# Patient Record
Sex: Male | Born: 1985 | Race: White | Hispanic: No | Marital: Single | Smoking: Current every day smoker
Health system: Southern US, Community
[De-identification: ages and names within clinical notes are randomized; demographics above are authoritative.]

## PROBLEM LIST (undated history)

## (undated) DIAGNOSIS — F1210 Cannabis abuse, uncomplicated: Secondary | ICD-10-CM

## (undated) DIAGNOSIS — Z720 Tobacco use: Secondary | ICD-10-CM

## (undated) DIAGNOSIS — F1990 Other psychoactive substance use, unspecified, uncomplicated: Secondary | ICD-10-CM

---
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
## 2009-03-22 ENCOUNTER — Emergency Department (HOSPITAL_COMMUNITY): Admission: EM | Admit: 2009-03-22 | Discharge: 2009-03-22 | Payer: Self-pay | Admitting: Emergency Medicine

## 2010-11-01 LAB — GC/CHLAMYDIA PROBE AMP, GENITAL
Chlamydia, DNA Probe: NEGATIVE
GC Probe Amp, Genital: POSITIVE — AB

## 2017-04-18 ENCOUNTER — Inpatient Hospital Stay (HOSPITAL_COMMUNITY)
Admission: EM | Admit: 2017-04-18 | Discharge: 2017-04-21 | DRG: 872 | Disposition: A | Discharge status: Home / Self Care | Payer: Self-pay | Attending: Family Medicine | Admitting: Family Medicine

## 2017-04-18 ENCOUNTER — Emergency Department (HOSPITAL_COMMUNITY): Payer: Self-pay

## 2017-04-18 ENCOUNTER — Other Ambulatory Visit: Payer: Self-pay

## 2017-04-18 ENCOUNTER — Encounter (HOSPITAL_COMMUNITY): Payer: Self-pay

## 2017-04-18 DIAGNOSIS — R079 Chest pain, unspecified: Secondary | ICD-10-CM | POA: Diagnosis present

## 2017-04-18 DIAGNOSIS — R000 Tachycardia, unspecified: Secondary | ICD-10-CM | POA: Diagnosis present

## 2017-04-18 DIAGNOSIS — I319 Disease of pericardium, unspecified: Secondary | ICD-10-CM | POA: Diagnosis present

## 2017-04-18 DIAGNOSIS — M25512 Pain in left shoulder: Secondary | ICD-10-CM | POA: Diagnosis present

## 2017-04-18 DIAGNOSIS — A419 Sepsis, unspecified organism: Principal | ICD-10-CM | POA: Diagnosis present

## 2017-04-18 DIAGNOSIS — F17210 Nicotine dependence, cigarettes, uncomplicated: Secondary | ICD-10-CM | POA: Diagnosis present

## 2017-04-18 DIAGNOSIS — F1990 Other psychoactive substance use, unspecified, uncomplicated: Secondary | ICD-10-CM | POA: Diagnosis present

## 2017-04-18 DIAGNOSIS — E876 Hypokalemia: Secondary | ICD-10-CM | POA: Diagnosis present

## 2017-04-18 DIAGNOSIS — Z825 Family history of asthma and other chronic lower respiratory diseases: Secondary | ICD-10-CM

## 2017-04-18 DIAGNOSIS — R0781 Pleurodynia: Secondary | ICD-10-CM | POA: Diagnosis present

## 2017-04-18 DIAGNOSIS — J352 Hypertrophy of adenoids: Secondary | ICD-10-CM | POA: Diagnosis present

## 2017-04-18 DIAGNOSIS — J9811 Atelectasis: Secondary | ICD-10-CM | POA: Diagnosis present

## 2017-04-18 DIAGNOSIS — I272 Pulmonary hypertension, unspecified: Secondary | ICD-10-CM | POA: Diagnosis present

## 2017-04-18 DIAGNOSIS — Z8249 Family history of ischemic heart disease and other diseases of the circulatory system: Secondary | ICD-10-CM

## 2017-04-18 DIAGNOSIS — M940 Chondrocostal junction syndrome [Tietze]: Secondary | ICD-10-CM | POA: Diagnosis present

## 2017-04-18 DIAGNOSIS — M542 Cervicalgia: Secondary | ICD-10-CM | POA: Diagnosis present

## 2017-04-18 DIAGNOSIS — Z720 Tobacco use: Secondary | ICD-10-CM | POA: Diagnosis present

## 2017-04-18 DIAGNOSIS — M009 Pyogenic arthritis, unspecified: Secondary | ICD-10-CM | POA: Diagnosis present

## 2017-04-18 DIAGNOSIS — Z791 Long term (current) use of non-steroidal anti-inflammatories (NSAID): Secondary | ICD-10-CM

## 2017-04-18 DIAGNOSIS — F1210 Cannabis abuse, uncomplicated: Secondary | ICD-10-CM | POA: Diagnosis present

## 2017-04-18 DIAGNOSIS — R509 Fever, unspecified: Secondary | ICD-10-CM

## 2017-04-18 DIAGNOSIS — Z86711 Personal history of pulmonary embolism: Secondary | ICD-10-CM

## 2017-04-18 DIAGNOSIS — J90 Pleural effusion, not elsewhere classified: Secondary | ICD-10-CM | POA: Diagnosis present

## 2017-04-18 DIAGNOSIS — L0390 Cellulitis, unspecified: Secondary | ICD-10-CM | POA: Diagnosis present

## 2017-04-18 DIAGNOSIS — K029 Dental caries, unspecified: Secondary | ICD-10-CM | POA: Diagnosis present

## 2017-04-18 DIAGNOSIS — R0682 Tachypnea, not elsewhere classified: Secondary | ICD-10-CM | POA: Diagnosis present

## 2017-04-18 HISTORY — DX: Cannabis abuse, uncomplicated: F12.10

## 2017-04-18 HISTORY — DX: Tobacco use: Z72.0

## 2017-04-18 HISTORY — DX: Other psychoactive substance use, unspecified, uncomplicated: F19.90

## 2017-04-18 LAB — CBC WITH DIFFERENTIAL/PLATELET
BASOS PCT: 0 %
Basophils Absolute: 0 10*3/uL (ref 0.0–0.1)
EOS ABS: 0.1 10*3/uL (ref 0.0–0.7)
EOS PCT: 1 %
HCT: 33.2 % — ABNORMAL LOW (ref 39.0–52.0)
Hemoglobin: 11.2 g/dL — ABNORMAL LOW (ref 13.0–17.0)
Lymphocytes Relative: 9 %
Lymphs Abs: 1 10*3/uL (ref 0.7–4.0)
MCH: 29.6 pg (ref 26.0–34.0)
MCHC: 33.7 g/dL (ref 30.0–36.0)
MCV: 87.8 fL (ref 78.0–100.0)
MONO ABS: 0.3 10*3/uL (ref 0.1–1.0)
Monocytes Relative: 3 %
Neutro Abs: 9.7 10*3/uL — ABNORMAL HIGH (ref 1.7–7.7)
Neutrophils Relative %: 87 %
Platelets: 344 10*3/uL (ref 150–400)
RBC: 3.78 MIL/uL — ABNORMAL LOW (ref 4.22–5.81)
RDW: 13.3 % (ref 11.5–15.5)
WBC: 11.2 10*3/uL — ABNORMAL HIGH (ref 4.0–10.5)

## 2017-04-18 LAB — URINALYSIS, ROUTINE W REFLEX MICROSCOPIC
Bilirubin Urine: NEGATIVE
Glucose, UA: NEGATIVE mg/dL
HGB URINE DIPSTICK: NEGATIVE
KETONES UR: NEGATIVE mg/dL
Leukocytes, UA: NEGATIVE
NITRITE: NEGATIVE
Protein, ur: NEGATIVE mg/dL
Specific Gravity, Urine: 1.002 — ABNORMAL LOW (ref 1.005–1.030)
pH: 6 (ref 5.0–8.0)

## 2017-04-18 LAB — COMPREHENSIVE METABOLIC PANEL
ALK PHOS: 57 U/L (ref 38–126)
ALT: 7 U/L — ABNORMAL LOW (ref 17–63)
AST: 16 U/L (ref 15–41)
Albumin: 3 g/dL — ABNORMAL LOW (ref 3.5–5.0)
Anion gap: 8 (ref 5–15)
BUN: 7 mg/dL (ref 6–20)
CALCIUM: 8.8 mg/dL — AB (ref 8.9–10.3)
CO2: 24 mmol/L (ref 22–32)
CREATININE: 0.53 mg/dL — AB (ref 0.61–1.24)
Chloride: 105 mmol/L (ref 101–111)
GFR calc non Af Amer: >60 mL/min (ref >60)
Glucose, Bld: 103 mg/dL — ABNORMAL HIGH (ref 65–99)
Potassium: 3.3 mmol/L — ABNORMAL LOW (ref 3.5–5.1)
SODIUM: 137 mmol/L (ref 135–145)
Total Bilirubin: 0.9 mg/dL (ref 0.3–1.2)
Total Protein: 7 g/dL (ref 6.5–8.1)

## 2017-04-18 LAB — I-STAT CG4 LACTIC ACID, ED
LACTIC ACID, VENOUS: 0.7 mmol/L (ref 0.5–1.9)
Lactic Acid, Venous: 1.44 mmol/L (ref 0.5–1.9)

## 2017-04-18 LAB — I-STAT TROPONIN, ED: Troponin i, poc: 0 ng/mL (ref 0.00–0.08)

## 2017-04-18 MED ORDER — POTASSIUM CHLORIDE CRYS ER 20 MEQ PO TBCR
40.0000 meq | EXTENDED_RELEASE_TABLET | Freq: Once | ORAL | Status: AC
  Administered 2017-04-18: 40 meq via ORAL
  Filled 2017-04-18: qty 2

## 2017-04-18 MED ORDER — SODIUM CHLORIDE 0.9 % IV BOLUS (SEPSIS): 1000.0000 mL | Freq: Once | INTRAVENOUS | Status: DC

## 2017-04-18 MED ORDER — KETOROLAC TROMETHAMINE 30 MG/ML IJ SOLN
30.0000 mg | Freq: Once | INTRAMUSCULAR | Status: DC
  Filled 2017-04-18: qty 1

## 2017-04-18 NOTE — ED Notes (Addendum)
Pt presents to the ED after being seen at [facility] in [facility] for chest pain. Pt states he feels as though they didn't do anything for him. Pt reports the pain started in his left shoulder and then progressed to his his upper left chest wall. Pt describes his pain as a pressure. Pt reports taking  of ibuprofen TID for the past 3 days and also reports taking  of oxycontin from off the street. Pt reports he got a prescription from [place] but didn't get it filled because he doesn't have insurance.

## 2017-04-18 NOTE — ED Triage Notes (Signed)
To triage via EMS.  Onset 6 days ago pain to left shoulder, neck, and left chest.  Pt seen at [facility] 2 days ago, dx: with pericarditis and costochondritis.  Pain has worsened and fever today. Pt has taken Ibuprofen 800 mg x 4, Oxy 15 mg, and EMS gave Tyelnol 975 mg.

## 2017-04-18 NOTE — H&P (Signed)
History and Physical    [name]:[id number] DOB: 12-09-1985 DOA: 04/18/2017  Referring MD/NP/PA:   PCP: Patient, No Pcp Per   Patient coming from:  The patient is coming from home.  At baseline, pt is independent for most of ADL.   Chief Complaint: Chest pain, left shoulder pain, left neck pain, fever and chills  HPI: Kevin Ashley is a 31 y.o. male with medical history significant of IV drug user, marijuana abuse, tobacco abuse, who presents with chest pain, left shoulder pain, left neck pain, fever and chills.  Patient states that he has been having chest pain, left neck pain and left shoulder pain in the past 6 days, which has been progressively getting worse. His chest pain is located in the frontal chest, constant, 8 out of 10 in severity, sharp, pleuritic chest pain. It is aggravated by deep breath. His neck pain and shoulder pain are also constant, 10 out of 10 in severity, sharp, nonradiating. He has mild erythema in left shoulder and left neck. Patient also has fever and chills during the same period of time. Patient was seen in [facility] 2 days ago, was diagnosed as possible Pericarditis and Costochondritis. He was started with ibuprofen. Patient states that he has been taking ibuprofen, without significant improvement. He denies cough and SOB. No nausea, vomiting, diarrhea, abdominal pain, symptoms of UTI or unilateral weakness.  ED Course: pt was found to have WBC 11.2, potassium is 3.3, creatinine normal, negative troponin, lactic acid 1.44, 0.70, negative urinalysis, temperature 101.9, tachycardia, tachypnea, oxygen saturation 98% on room air. X-ray of left shoulder negative. CXR showed possible left lower lobe opacity. Patient is admitted to telemetry bed as inpatient.  Review of Systems:   General: has fevers, chills, no body weight gain, has poor appetite, has fatigue HEENT: no blurry vision, hearing changes or sore throat. Has left neck pain. Respiratory: no  dyspnea, coughing, wheezing CV: has chest pain, no palpitations GI: no nausea, vomiting, abdominal pain, diarrhea, constipation GU: no dysuria, burning on urination, increased urinary frequency, hematuria  Ext: no leg edema Neuro: no unilateral weakness, numbness, or tingling, no vision change or hearing loss Skin: no rash, no skin tear. MSK: No muscle spasm, no deformity, no limitation of range of movement in spin. Has left shoulder pain. Heme: No easy bruising.  Travel history: No recent long distant travel.  Allergy: No Known Allergies  Past Medical History:  Diagnosis Date  . IVDU (intravenous drug user)   . Marijuana abuse   . Tobacco abuse     History reviewed. No pertinent surgical history.  Social History:  reports that he has been smoking Cigarettes.  He has been smoking about 0.50 packs per day. He has never used smokeless tobacco. He reports that he uses drugs, including Marijuana. He reports that he does not drink alcohol.  Family History:  Family History  Problem Relation Age of Onset  . COPD Mother   . Valvular heart disease Brother      Prior to Admission medications   Medication Sig Start Date End Date Taking? Authorizing Provider  IBUPROFEN PO Take 2 tablets by mouth 2 (two) times daily as needed (pain).   Yes [provider]    Physical Exam: Vitals:   04/18/17 1526 04/18/17 1940 04/18/17 2217 04/19/17 0000  BP: 133/81 118/61 124/80   Pulse: (!) 104 76 74   Resp: 18 16 (!) 21   Temp: (!) 101.9 F (38.8 C)  TempSrc: Oral     SpO2: 98% 100% 99%   Weight:    77.1 kg (169 lb 15.6 oz)  Height:    '5\' 8"'$  (1.727 m)   General: Not in acute distress HEENT:       Eyes: PERRL, EOMI, no scleral icterus. Has tenderness in left neck, with mild erythema in left and neck       ENT: No discharge from the ears and nose, no pharynx injection, no tonsillar enlargement.        Neck: No JVD, no bruit, no mass felt. Heme: No neck lymph node  enlargement. Cardiac: S1/S2, RRR, No murmurs, No gallops or rubs. Respiratory: No rales, wheezing, rhonchi or rubs. GI: Soft, nondistended, nontender, no rebound pain, no organomegaly, BS present. GU: No hematuria Ext: No pitting leg edema bilaterally. 2+DP/PT pulse bilaterally. Musculoskeletal: has erythema and tenderness in the left shoulder, without significant effusion Skin: No rashes.  Neuro: Alert, oriented X3, cranial nerves II-XII grossly intact, moves all extremities normally Psych: Patient is not psychotic, no suicidal or hemocidal ideation.  Labs on Admission: I have personally reviewed following labs and imaging studies  CBC:  Recent Labs Lab 04/18/17 1545  WBC 11.2*  NEUTROABS 9.7*  HGB 11.2*  HCT 33.2*  MCV 87.8  PLT 885   Basic Metabolic Panel:  Recent Labs Lab 04/18/17 1545  NA 137  K 3.3*  CL 105  CO2 24  GLUCOSE 103*  BUN 7  CREATININE 0.53*  CALCIUM 8.8*   GFR: Estimated Creatinine Clearance: 133 mL/min (A) (by C-G formula based on SCr of 0.53 mg/dL (L)). Liver Function Tests:  Recent Labs Lab 04/18/17 1545  AST 16  ALT 7*  ALKPHOS 57  BILITOT 0.9  PROT 7.0  ALBUMIN 3.0*   No results for input(s): LIPASE, AMYLASE in the last 168 hours. No results for input(s): AMMONIA in the last 168 hours. Coagulation Profile: No results for input(s): INR, PROTIME in the last 168 hours. Cardiac Enzymes: No results for input(s): CKTOTAL, CKMB, CKMBINDEX, TROPONINI in the last 168 hours. BNP (last 3 results) No results for input(s): PROBNP in the last 8760 hours. HbA1C: No results for input(s): HGBA1C in the last 72 hours. CBG: No results for input(s): GLUCAP in the last 168 hours. Lipid Profile: No results for input(s): CHOL, HDL, LDLCALC, TRIG, CHOLHDL, LDLDIRECT in the last 72 hours. Thyroid Function Tests: No results for input(s): TSH, T4TOTAL, FREET4, T3FREE, THYROIDAB in the last 72 hours. Anemia Panel: No results for input(s): VITAMINB12,  FOLATE, FERRITIN, TIBC, IRON, RETICCTPCT in the last 72 hours. Urine analysis:    Component Value Date/Time   COLORURINE STRAW (A) 04/18/2017 1927   APPEARANCEUR CLEAR 04/18/2017 1927   LABSPEC 1.002 (L) 04/18/2017 1927   PHURINE 6.0 04/18/2017 1927   GLUCOSEU NEGATIVE 04/18/2017 1927   HGBUR NEGATIVE 04/18/2017 1927   BILIRUBINUR NEGATIVE 04/18/2017 [place] NEGATIVE 04/18/2017 1927   PROTEINUR NEGATIVE 04/18/2017 1927   NITRITE NEGATIVE 04/18/2017 1927   LEUKOCYTESUR NEGATIVE 04/18/2017 1927   Sepsis Labs: '@LABRCNTIP'$ (procalcitonin:4,lacticidven:4) ) Recent Results (from the past 240 hour(s))  Culture, blood (routine x 2)     Status: None (Preliminary result)   Collection Time: 04/18/17 10:45 PM  Result Value Ref Range Status   Specimen Description BLOOD RIGHT ANTECUBITAL  Final   Special Requests   Final    BOTTLES DRAWN AEROBIC AND ANAEROBIC Blood Culture adequate volume   Culture PENDING  Incomplete   Report Status PENDING  Incomplete  Radiological Exams on Admission: Dg Chest 2 View  Result Date: 04/18/2017 CLINICAL DATA:  Pt reports onset 6 days ago pain to left shoulder, neck, and left chest. Also reports some SOB. Pt reports he was seen at [facility] 2 days ago, dx: with pericarditis and costochondritis. Pain has worsened and fever today. Smoker EXAM: CHEST  2 VIEW COMPARISON:  04/15/2017 FINDINGS: There is focal opacity in the posterior left lung base which may reflect pneumonia or atelectasis. This was present on the prior radiographs, and is without significant change. Remainder of the lungs is clear. There may be a minimal left pleural effusion. No right pleural effusion. No pneumothorax. Heart, mediastinum and hila are unremarkable. Skeletal structures are unremarkable. IMPRESSION: 1. Posterior left lower lobe opacity that is most likely atelectasis. Infection is possible. This is similar to what was present on the prior radiographs. 2. No other  abnormalities. Electronically Signed   By: Lajean Manes M.D.   On: 04/18/2017 16:31   Dg Shoulder Left  Result Date: 04/18/2017 CLINICAL DATA:  Pain for 6 days.  Unable to lift arm. EXAM: LEFT SHOULDER - 2+ VIEW COMPARISON:  None. FINDINGS: The humeral head is well-formed and located. The subacromial, glenohumeral and acromioclavicular joint spaces are intact. No destructive bony lesions. Soft tissue planes are non-suspicious. IMPRESSION: Negative. Electronically Signed   By: Elon Alas M.D.   On: 04/18/2017 23:47     EKG: Independently reviewed.  Tachycardia, QTC 417, anteroseptal infarction pattern.  Assessment/Plan Principal Problem:   Sepsis ([facility]) Active Problems:   IVDU (intravenous drug user)   Tobacco abuse   Marijuana abuse   Hypokalemia   Chest pain   Left shoulder pain   Neck pain on left side   Sepsis [facility]): Patient meets criteria for sepsis with leukocytosis, tachycardia, tachypnea and fever. Currently hemodynamically stable. Lactic acid is normal. DD is endocarditis given a history of IVDU, pulmonary septic emboli, septic left shoulder joint and cellulitis of the left neck.  - will admit to tele bed as inpt - Empiric antimicrobial treatment with vancomycin and Zosyn per pharmacy - PRN Zofran for nausea, Percocet for pain - continue ibuprofen - Blood cultures x 2  - ESR and CRP - will get Procalcitonin and trend lactic acid levels per sepsis protocol. - IVF: 2.5 L of NS bolus in ED, followed by 125 cc/h - will get CT-left shoulder, Chest and left neck (I was called by CT and told that they will do CT-chest and neck, try to include the left shoulder in two images, will not need Ct-left shoulder) - 2 d echo  IVDU, tobacco abuse and marijuana abuse -Did counseling about importance of quitting subsidence -Check UDS -check HIV ab  Hypokalemia: K=3.3 on admission. - Repleted - Check Mg level    DVT ppx: SQ Lovenox Code Status: Full code Family  Communication: None at bed side.   Disposition Plan:  Anticipate discharge back to previous home environment Consults called:  none Admission status: Inpatient/tele       Date of Service 04/19/2017    Ivor Costa [facility] Pager [phone]  If 7PM-7AM, please contact night-coverage [email or web address] Password [email or web address] 04/19/2017, 2:07 AM

## 2017-04-18 NOTE — ED Notes (Signed)
The pt is still c/o chest pain  He has been traiged

## 2017-04-18 NOTE — ED Provider Notes (Signed)
[facility]-EMERGENCY DEPT Provider Note   CSN: [id number] Arrival date & time: 04/18/17  1522     History   Chief Complaint No chief complaint on file.   HPI Kevin Ashley is a 31 y.o. male.  HPI   Kevin Ashley is a 31 y.o. male, with a history of IV drug use, presenting to the ED with Chest pain and left shoulder pain beginning about 6 days ago. Pain originally began in the superior left shoulder and has since spread into the left chest. Pain has been worsening since it began. Pain in the shoulder is constant, chest pain intermittent. Pain is described as a squeezing, 10/10, worse with deep breathing. Patient also endorses chills and shortness of breath. Patient was seen at [facility] on September 20, diagnosed with pericarditis versus costochondritis, and treated with naproxen  twice a day. Patient states he does not have insurance and therefore did not get the naproxen filled, but has been taking "2 pills 3 times a day from a previous prescription that I think are ibuprofen 600 mg." Patient endorses taking 2 doses of the ibuprofen today as well as one 15 mg oxycodone tablet that he obtained "on the street." He endorses occasional PO unprescribed oxycodone use. Endorses previous IV drug use of oxycodone, but states he has not used this drug in this way for at least a year and a half.  Denies N/V/D, cough, abdominal pain, rashes, peripheral edema, or any other complaints.    Past Medical History:  Diagnosis Date  . IVDU (intravenous drug user)   . Marijuana abuse   . Tobacco abuse     Patient Active Problem List   Diagnosis Date Noted  . Sepsis (H[facility]) 04/18/2017  . Hypokalemia 04/18/2017  . Chest pain 04/18/2017  . Left shoulder pain 04/18/2017  . Neck pain on left side 04/18/2017  . IVDU (intravenous drug user)   . Tobacco abuse   . Marijuana abuse     History reviewed. No pertinent surgical history.     Home Medications    Prior to Admission medications     Medication Sig Start Date End Date Taking? Authorizing Provider  IBUPROFEN PO Take 2 tablets by mouth 2 (two) times daily as needed (pain).   Yes [provider]    Family History Family History  Problem Relation Age of Onset  . COPD Mother   . Valvular heart disease Brother     Social History Social History  Substance Use Topics  . Smoking status: Current Every Day Smoker    Packs/day: 0.50    Types: Cigarettes  . Smokeless tobacco: Never Used  . Alcohol use No     Allergies   Patient has no known allergies.   Review of Systems Review of Systems  Constitutional: Positive for chills.  Respiratory: Positive for shortness of breath. Negative for cough.   Cardiovascular: Positive for chest pain. Negative for leg swelling.  Gastrointestinal: Negative for abdominal pain, diarrhea, nausea and vomiting.  Musculoskeletal: Positive for back pain.  Neurological: Negative for dizziness, weakness, light-headedness, numbness and headaches.  All other systems reviewed and are negative.    Physical Exam Updated Vital Signs BP 118/61   Pulse 76   Temp (!) 101.9 F (38.8 C) (Oral)   Resp 16   SpO2 100%   Physical Exam  Constitutional: He appears well-developed and well-nourished. No distress.  HENT:  Head: Normocephalic and atraumatic.  Eyes: Conjunctivae are normal.  Neck: Neck supple.  Cardiovascular: Normal  rate, regular rhythm and intact distal pulses.   Pulses:      Radial pulses are 2+ on the right side, and 2+ on the left side.  Questionable murmur most notable at the tricuspid and mitral locations. Change in the patient's position does not increase or decrease his pain.  Pulmonary/Chest: Effort normal and breath sounds normal. No respiratory distress.    Abdominal: Soft. There is no tenderness. There is no guarding.  Musculoskeletal: He exhibits tenderness. He exhibits no edema.  Tenderness to the anterior and superior left shoulder and clavicle. No  noted edema. Full passive range of motion with pain noted especially with abduction and flexion.  Lymphadenopathy:    He has no cervical adenopathy.  Neurological: He is alert.  Skin: Skin is warm and dry. Capillary refill takes less than 2 seconds. He is not diaphoretic.  Psychiatric: He has a normal mood and affect. His behavior is normal.  Nursing note and vitals reviewed.    ED Treatments / Results  Labs (all labs ordered are listed, but only abnormal results are displayed) Labs Reviewed  COMPREHENSIVE METABOLIC PANEL - Abnormal; Notable for the following:       Result Value   Potassium 3.3 (*)    Glucose, Bld 103 (*)    Creatinine, Ser 0.53 (*)    Calcium 8.8 (*)    Albumin 3.0 (*)    ALT 7 (*)    All other components within normal limits  CBC WITH DIFFERENTIAL/PLATELET - Abnormal; Notable for the following:    WBC 11.2 (*)    RBC 3.78 (*)    Hemoglobin 11.2 (*)    HCT 33.2 (*)    Neutro Abs 9.7 (*)    All other components within normal limits  URINALYSIS, ROUTINE W REFLEX MICROSCOPIC - Abnormal; Notable for the following:    Color, Urine STRAW (*)    Specific Gravity, Urine 1.002 (*)    All other components within normal limits  CULTURE, BLOOD (ROUTINE X 2)  CULTURE, BLOOD (ROUTINE X 2)  CULTURE, BLOOD (SINGLE)  RAPID URINE DRUG SCREEN, HOSP PERFORMED  MAGNESIUM  HIV ANTIBODY (ROUTINE TESTING)  BASIC METABOLIC PANEL  CBC  LACTIC ACID, PLASMA  PROCALCITONIN  PROTIME-INR  APTT  I-STAT CG4 LACTIC ACID, ED  I-STAT CG4 LACTIC ACID, ED  I-STAT TROPONIN, ED  I-STAT TROPONIN, ED    EKG  EKG Interpretation  Date/Time:  Sunday April 18 2017 15:19:56 EDT Ventricular Rate:  100 PR Interval:  130 QRS Duration: 88 QT Interval:  324 QTC Calculation: 417 R Axis:   78 Text Interpretation:  Normal sinus rhythm Normal ECG no prior EKG  Confirmed by Crista Curb [phone]) on 04/18/2017 10:28:25 PM       Radiology Dg Chest 2 View  Result Date: 04/18/2017 CLINICAL  DATA:  Pt reports onset 6 days ago pain to left shoulder, neck, and left chest. Also reports some SOB. Pt reports he was seen at [facility] 2 days ago, dx: with pericarditis and costochondritis. Pain has worsened and fever today. Smoker EXAM: CHEST  2 VIEW COMPARISON:  04/15/2017 FINDINGS: There is focal opacity in the posterior left lung base which may reflect pneumonia or atelectasis. This was present on the prior radiographs, and is without significant change. Remainder of the lungs is clear. There may be a minimal left pleural effusion. No right pleural effusion. No pneumothorax. Heart, mediastinum and hila are unremarkable. Skeletal structures are unremarkable. IMPRESSION: 1. Posterior left lower lobe opacity that is most  likely atelectasis. Infection is possible. This is similar to what was present on the prior radiographs. 2. No other abnormalities. Electronically Signed   By: Amie Portland M.D.   On: 04/18/2017 16:31   Dg Shoulder Left  Result Date: 04/18/2017 CLINICAL DATA:  Pain for 6 days.  Unable to lift arm. EXAM: LEFT SHOULDER - 2+ VIEW COMPARISON:  None. FINDINGS: The humeral head is well-formed and located. The subacromial, glenohumeral and acromioclavicular joint spaces are intact. No destructive bony lesions. Soft tissue planes are non-suspicious. IMPRESSION: Negative. Electronically Signed   By: Awilda Metro M.D.   On: 04/18/2017 23:47    Procedures Angiocath insertion Date/Time: 04/19/2017 12:02 AM Performed by: Anselm Pancoast Authorized by: Harolyn Rutherford C  Consent: Verbal consent obtained. Risks and benefits: risks, benefits and alternatives were discussed Consent given by: patient Patient understanding: patient states understanding of the procedure being performed Patient consent: the patient's understanding of the procedure matches consent given Procedure consent: procedure consent matches procedure scheduled Patient identity confirmed: verbally with patient Local  anesthesia used: no  Anesthesia: Local anesthesia used: no  Sedation: Patient sedated: no Patient tolerance: Patient tolerated the procedure well with no immediate complications Comments: Ultrasound-guided 20-gauge Angiocath started in the right distal, medial, upper arm. Positive flash and blood return. Flushes easily without pain, swelling, or other signs of infiltration.    (including critical care time)  Medications Ordered in ED Medications  ibuprofen (ADVIL,MOTRIN) 100 MG/5ML suspension 400 mg (not administered)  sodium chloride 0.9 % bolus 2,500 mL (not administered)  0.9 %  sodium chloride infusion (not administered)  enoxaparin (LOVENOX) injection 40 mg (not administered)  acetaminophen (TYLENOL) tablet 650 mg (not administered)    Or  acetaminophen (TYLENOL) suppository 650 mg (not administered)  ondansetron (ZOFRAN) tablet 4 mg (not administered)    Or  ondansetron (ZOFRAN) injection 4 mg (not administered)  zolpidem (AMBIEN) tablet 5 mg (not administered)  nicotine (NICODERM CQ - dosed in mg/24 hours) patch 21 mg (not administered)  vancomycin (VANCOCIN) IVPB 1000 mg/200 mL premix (not administered)  piperacillin-tazobactam (ZOSYN) IVPB 3.375 g (not administered)  piperacillin-tazobactam (ZOSYN) IVPB 3.375 g (not administered)  potassium chloride SA (K-DUR,KLOR-CON) CR tablet 40 mEq (40 mEq Oral Given 04/18/17 2312)     Initial Impression / Assessment and Plan / ED Course  I have reviewed the triage vital signs and the nursing notes.  Pertinent labs & imaging results that were available during my care of the patient were reviewed by me and considered in my medical decision making (see chart for details).  Clinical Course as of Apr 20 47  Wynelle Link Apr 18, 2017  2143 Patient not yet in the room.  [SJ]  2347 Spoke with Dr. Clyde Lundborg, hospitalist. Agrees to admit the patient.  [SJ]  2355 Upon reevaluation, patient has tenderness and erythema to the left superior shoulder and  left neck. Patient more hesitant to move left shoulder.   [SJ]    Clinical Course User Index [SJ] Joy, Shawn C, PA-C    Patient presents with left shoulder and chest pain for the last 6 days. Febrile with history of IV drug abuse. 3 sets of blood cultures drawn from 3 different sites. Admission for workup of possible endocarditis. Septic joint in left shoulder thought to be less likely due to ability to range the joint and lack of local edema, however, not out of the differential.    Findings and plan of care discussed with Crista Curb, MD.   Vitals:  04/18/17 1526 04/18/17 1940 04/18/17 2217 04/19/17 0000  BP: 133/81 118/61 124/80   Pulse: (!) 104 76 74   Resp: 18 16 (!) 21   Temp: (!) 101.9 F (38.8 C)     TempSrc: Oral     SpO2: 98% 100% 99%   Weight:    77.1 kg (169 lb 15.6 oz)  Height:     (1.727 m)     Final Clinical Impressions(s) / ED Diagnoses   Final diagnoses:  IVDU (intravenous drug user)  Chest pain, unspecified type  Fever, unspecified fever cause  Tobacco abuse  Marijuana abuse    New Prescriptions New Prescriptions   No medications on file     Concepcion Living 04/19/17 0049    Lavera Guise, MD 04/19/17 0110

## 2017-04-18 NOTE — ED Notes (Signed)
Pt asking how long until he is seem he reports that he has been here by ems at 1300 however he was checked in at 1522 which I  Told him

## 2017-04-19 ENCOUNTER — Inpatient Hospital Stay (HOSPITAL_COMMUNITY): Payer: Self-pay

## 2017-04-19 ENCOUNTER — Encounter (HOSPITAL_COMMUNITY): Payer: Self-pay | Admitting: Internal Medicine

## 2017-04-19 DIAGNOSIS — R072 Precordial pain: Secondary | ICD-10-CM

## 2017-04-19 LAB — CBC
HCT: 31.1 % — ABNORMAL LOW (ref 39.0–52.0)
Hemoglobin: 10.2 g/dL — ABNORMAL LOW (ref 13.0–17.0)
MCH: 29.1 pg (ref 26.0–34.0)
MCHC: 32.8 g/dL (ref 30.0–36.0)
MCV: 88.9 fL (ref 78.0–100.0)
Platelets: 228 10*3/uL (ref 150–400)
RBC: 3.5 MIL/uL — ABNORMAL LOW (ref 4.22–5.81)
RDW: 13.6 % (ref 11.5–15.5)
WBC: 8.5 10*3/uL (ref 4.0–10.5)

## 2017-04-19 LAB — BASIC METABOLIC PANEL
Anion gap: 7 (ref 5–15)
BUN: 5 mg/dL — AB (ref 6–20)
CALCIUM: 7.9 mg/dL — AB (ref 8.9–10.3)
CO2: 23 mmol/L (ref 22–32)
Chloride: 108 mmol/L (ref 101–111)
Creatinine, Ser: 0.51 mg/dL — ABNORMAL LOW (ref 0.61–1.24)
GFR calc Af Amer: >60 mL/min (ref >60)
GLUCOSE: 95 mg/dL (ref 65–99)
Potassium: 3.5 mmol/L (ref 3.5–5.1)
SODIUM: 138 mmol/L (ref 135–145)

## 2017-04-19 LAB — ECHOCARDIOGRAM COMPLETE
Height: 68 in
WEIGHTICAEL: 2719.59 [oz_av]

## 2017-04-19 LAB — RAPID URINE DRUG SCREEN, HOSP PERFORMED
AMPHETAMINES: NOT DETECTED
BENZODIAZEPINES: NOT DETECTED
Barbiturates: NOT DETECTED
COCAINE: NOT DETECTED
OPIATES: NOT DETECTED
Tetrahydrocannabinol: POSITIVE — AB

## 2017-04-19 LAB — SEDIMENTATION RATE: SED RATE: 84 mm/h — AB (ref 0–16)

## 2017-04-19 LAB — I-STAT TROPONIN, ED: Troponin i, poc: 0 ng/mL (ref 0.00–0.08)

## 2017-04-19 LAB — LACTIC ACID, PLASMA: LACTIC ACID, VENOUS: 0.8 mmol/L (ref 0.5–1.9)

## 2017-04-19 LAB — PROTIME-INR
INR: 1.13
Prothrombin Time: 14.5 seconds (ref 11.4–15.2)

## 2017-04-19 LAB — APTT: aPTT: 77 seconds — ABNORMAL HIGH (ref 24–36)

## 2017-04-19 LAB — CBG MONITORING, ED: GLUCOSE-CAPILLARY: 114 mg/dL — AB (ref 65–99)

## 2017-04-19 LAB — HIV ANTIBODY (ROUTINE TESTING W REFLEX): HIV SCREEN 4TH GENERATION: NONREACTIVE

## 2017-04-19 LAB — MAGNESIUM: Magnesium: 1.5 mg/dL — ABNORMAL LOW (ref 1.7–2.4)

## 2017-04-19 LAB — C-REACTIVE PROTEIN: CRP: 9.3 mg/dL — AB (ref <1.0)

## 2017-04-19 LAB — PROCALCITONIN: PROCALCITONIN: 0.58 ng/mL

## 2017-04-19 MED ORDER — IBUPROFEN 100 MG/5ML PO SUSP
400.0000 mg | Freq: Four times a day (QID) | ORAL | Status: DC
  Administered 2017-04-19 – 2017-04-21 (×11): 400 mg via ORAL
  Filled 2017-04-19 (×12): qty 20

## 2017-04-19 MED ORDER — PIPERACILLIN-TAZOBACTAM 3.375 G IVPB 30 MIN
3.3750 g | Freq: Once | INTRAVENOUS | Status: AC
  Administered 2017-04-19: 3.375 g via INTRAVENOUS
  Filled 2017-04-19: qty 50

## 2017-04-19 MED ORDER — ENOXAPARIN SODIUM 40 MG/0.4ML ~~LOC~~ SOLN
40.0000 mg | SUBCUTANEOUS | Status: DC
  Administered 2017-04-20: 40 mg via SUBCUTANEOUS
  Filled 2017-04-19 (×3): qty 0.4

## 2017-04-19 MED ORDER — VANCOMYCIN HCL IN DEXTROSE 1-5 GM/200ML-% IV SOLN
1000.0000 mg | Freq: Three times a day (TID) | INTRAVENOUS | Status: DC
  Administered 2017-04-19 – 2017-04-21 (×9): 1000 mg via INTRAVENOUS
  Filled 2017-04-19 (×11): qty 200

## 2017-04-19 MED ORDER — ACETAMINOPHEN 325 MG PO TABS
650.0000 mg | ORAL_TABLET | Freq: Four times a day (QID) | ORAL | Status: DC | PRN
  Administered 2017-04-20: 650 mg via ORAL
  Filled 2017-04-19: qty 2

## 2017-04-19 MED ORDER — ZOLPIDEM TARTRATE 5 MG PO TABS
5.0000 mg | ORAL_TABLET | Freq: Every evening | ORAL | Status: DC | PRN
  Administered 2017-04-19 – 2017-04-20 (×3): 5 mg via ORAL
  Filled 2017-04-19 (×3): qty 1

## 2017-04-19 MED ORDER — IOPAMIDOL (ISOVUE-300) INJECTION 61%
INTRAVENOUS | Status: AC
  Administered 2017-04-19: 75 mL
  Filled 2017-04-19: qty 75

## 2017-04-19 MED ORDER — NICOTINE 21 MG/24HR TD PT24
21.0000 mg | MEDICATED_PATCH | Freq: Every day | TRANSDERMAL | Status: DC
Start: 2017-04-19 — End: 2017-04-21
  Administered 2017-04-19 – 2017-04-20 (×2): 21 mg via TRANSDERMAL
  Filled 2017-04-19 (×2): qty 1

## 2017-04-19 MED ORDER — OXYCODONE-ACETAMINOPHEN 5-325 MG PO TABS
1.0000 | ORAL_TABLET | ORAL | Status: DC | PRN
  Administered 2017-04-19 – 2017-04-21 (×9): 1 via ORAL
  Filled 2017-04-19 (×10): qty 1

## 2017-04-19 MED ORDER — ONDANSETRON HCL 4 MG/2ML IJ SOLN: 4.0000 mg | Freq: Four times a day (QID) | INTRAMUSCULAR | Status: DC | PRN

## 2017-04-19 MED ORDER — PIPERACILLIN-TAZOBACTAM 3.375 G IVPB
3.3750 g | Freq: Three times a day (TID) | INTRAVENOUS | Status: DC
  Administered 2017-04-19 – 2017-04-21 (×7): 3.375 g via INTRAVENOUS
  Filled 2017-04-19 (×10): qty 50

## 2017-04-19 MED ORDER — ONDANSETRON HCL 4 MG PO TABS: 4.0000 mg | ORAL_TABLET | Freq: Four times a day (QID) | ORAL | Status: DC | PRN

## 2017-04-19 MED ORDER — SODIUM CHLORIDE 0.9 % IV SOLN
INTRAVENOUS | Status: DC
  Administered 2017-04-19 – 2017-04-21 (×5): via INTRAVENOUS

## 2017-04-19 MED ORDER — SODIUM CHLORIDE 0.9 % IV BOLUS (SEPSIS)
2500.0000 mL | Freq: Once | INTRAVENOUS | Status: AC
  Administered 2017-04-19: 2500 mL via INTRAVENOUS

## 2017-04-19 MED ORDER — ACETAMINOPHEN 650 MG RE SUPP: 650.0000 mg | Freq: Four times a day (QID) | RECTAL | Status: DC | PRN

## 2017-04-19 NOTE — Progress Notes (Signed)
  Echocardiogram 2D Echocardiogram has been performed.  Pieter Partridge 04/19/2017, 10:31 AM

## 2017-04-19 NOTE — ED Notes (Signed)
Attempted report 

## 2017-04-19 NOTE — ED Notes (Signed)
Meal tray ordered 

## 2017-04-19 NOTE — Progress Notes (Signed)
Patient ID: Kevin Ashley, male   DOB: 1986/04/18, 31 y.o.   MRN: [id number]  PROGRESS NOTE    Kevin Ashley  YME:[id number] DOB: 10/30/1985 DOA: 04/18/2017  PCP: Patient, No Pcp Per   Brief Narrative:  31 year old male with history of IV drug use, marijuana and tobacco abuse who presented to ED with chest pain, left shoulder and neck pain, fever and chills for past week or so prior to this admission. Chest pain is located in the mid sternal area, 8 out of 10 in severity, pleuritic in nature, radiating to shoulder and neck. Patient was in [facility] about 2 days ago and was diagnosed with possible pericarditis and costochondritis and he was started on ibuprofen. His symptoms did not improve.  On admission, blood pressure was stable. Patient was febrile with temperature of 101.88F, tachycardic, tachypneic. His blood work was significant for mild leukocytosis of 11.2, hemoglobin 11.2, potassium 3.3, normal lactic acid. Chest x-ray showed posterior left lower lobe opacity most likely atelectasis or possible infection. CT chest showed small left pleural effusion and adjacent compressive atelectasis but no large central pulmonary embolus or septic emboli. CT shoulder showed no evidence of fracture or bone destruction but it did show possible parapneumonic effusion with pulmonary consolidation in the left lower lobe. Sepsis criteria met on the admission and patient was started on vancomycin and Zosyn while awaiting culture results.   Assessment & Plan:   Principal Problem:   Sepsis ([place]) / Possible parapneumonic effusion / Leukocytosis  - Sepsis criteria met on admission with fever, tachycardia, tachypnea, leukocytosis - Lactic acid is WNL - UA unremarkable  - CXR showed posterior left lower lobe opacity that is most likely atelectasis. Infection is possible. - CT shoulder with possible parapneumonic effusion with pulmonary consolidation in the left lower lobe - Blood cultures and urine  culture ordered - pending  - Started broad spectrum antibiotics: vanco and zosyn - Rule out possible endocarditis, TTE ordered for now  Active Problems:   IVDU (intravenous drug user) / tobacco abuse / marijuana abuse - Counseled on drug abuse    Chest pain / Left shoulder and neck pain / Possible odontogenic cellulitis - Likely in the setting of sepsis and acute infectious process - CT scan of the neck showed multiple dental caries and periapical lucencies. Dental disease involving teeth 17 and 18 is adjacent to very mild lower left facial fat infiltration, possibly indicating odontogenic cellulitis in the appropriate clinical context.  Multiple mildly enlarged reactive cervical lymph nodes.  Enlarged adenoid and palatine tonsils without abscess or drainable fluid collection. In this setting, this is likely reactive lymphatic tissue. Correlate for symptoms of tonsillopharyngitis.  - CT chest showed small left pleural effusion with adjacent compressive atelectasis.  No large central pulmonary embolus. No evidence of septic emboli.  - CT shoulder showed no evidence of fracture, bone destruction or bursitis. - Pt already getting abx for sepsis     Hypokalemia - Due to sepsis - Supplemented and WNL  DVT prophylaxis: Lovenox subcutaneous Code Status: full code  Family Communication: Family at the bedside Disposition Plan: Not yet stable for discharge, receiving antibiotics for sepsis and possible endocarditis   Consultants:   None   Procedures:   None   Antimicrobials:   Vanco and zosyn 04/18/2017 -_>    Subjective: Has left shoulder pain.  Objective: Vitals:   04/19/17 0340 04/19/17 0400 04/19/17 0500 04/19/17 0600  BP: 107/68 112/86 115/73 107/71  Pulse:  Resp: '19 11 12 18  '$ Temp:      TempSrc:      SpO2:      Weight:      Height:        Intake/Output Summary (Last 24 hours) at 04/19/17 0814 Last data filed at 04/19/17 0653  Gross per 24 hour  Intake              2950 ml  Output                0 ml  Net             2950 ml   Filed Weights   04/19/17 0000  Weight: 77.1 kg (169 lb 15.6 oz)    Examination:  General exam: Appears calm and comfortable  Respiratory system: Clear to auscultation. Respiratory effort normal. Cardiovascular system: S1 & S2 heard, RRR. Gastrointestinal system: Abdomen is nondistended, soft and nontender. No organomegaly or masses felt. Normal bowel sounds heard. Central nervous system: Alert and oriented. No focal neurological deficits. Extremities: Symmetric 5 x 5 power. Skin: No rashes, lesions or ulcers Psychiatry: Judgement and insight appear normal. Mood & affect appropriate.   Data Reviewed: I have personally reviewed following labs and imaging studies  CBC:  Recent Labs Lab 04/18/17 1545 04/19/17 0406  WBC 11.2* 8.5  NEUTROABS 9.7*  --   HGB 11.2* 10.2*  HCT 33.2* 31.1*  MCV 87.8 88.9  PLT 344 841   Basic Metabolic Panel:  Recent Labs Lab 04/18/17 1545 04/19/17 0406  NA 137 138  K 3.3* 3.5  CL 105 108  CO2 24 23  GLUCOSE 103* 95  BUN 7 5*  CREATININE 0.53* 0.51*  CALCIUM 8.8* 7.9*  MG  --  1.5*   GFR: Estimated Creatinine Clearance: 133 mL/min (A) (by C-G formula based on SCr of 0.51 mg/dL (L)). Liver Function Tests:  Recent Labs Lab 04/18/17 1545  AST 16  ALT 7*  ALKPHOS 57  BILITOT 0.9  PROT 7.0  ALBUMIN 3.0*   No results for input(s): LIPASE, AMYLASE in the last 168 hours. No results for input(s): AMMONIA in the last 168 hours. Coagulation Profile:  Recent Labs Lab 04/19/17 0406  INR 1.13   Cardiac Enzymes: No results for input(s): CKTOTAL, CKMB, CKMBINDEX, TROPONINI in the last 168 hours. BNP (last 3 results) No results for input(s): PROBNP in the last 8760 hours. HbA1C: No results for input(s): HGBA1C in the last 72 hours. CBG:  Recent Labs Lab 04/19/17 0808  GLUCAP 114*   Lipid Profile: No results for input(s): CHOL, HDL, LDLCALC, TRIG, CHOLHDL,  LDLDIRECT in the last 72 hours. Thyroid Function Tests: No results for input(s): TSH, T4TOTAL, FREET4, T3FREE, THYROIDAB in the last 72 hours. Anemia Panel: No results for input(s): VITAMINB12, FOLATE, FERRITIN, TIBC, IRON, RETICCTPCT in the last 72 hours. Urine analysis:    Component Value Date/Time   COLORURINE STRAW (A) 04/18/2017 1927   APPEARANCEUR CLEAR 04/18/2017 1927   LABSPEC 1.002 (L) 04/18/2017 1927   PHURINE 6.0 04/18/2017 1927   GLUCOSEU NEGATIVE 04/18/2017 1927   HGBUR NEGATIVE 04/18/2017 1927   BILIRUBINUR NEGATIVE 04/18/2017 [place] NEGATIVE 04/18/2017 1927   PROTEINUR NEGATIVE 04/18/2017 1927   NITRITE NEGATIVE 04/18/2017 1927   LEUKOCYTESUR NEGATIVE 04/18/2017 1927   Sepsis Labs: '@LABRCNTIP'$ (procalcitonin:4,lacticidven:4)    Recent Results (from the past 240 hour(s))  Culture, blood (routine x 2)     Status: None (Preliminary result)   Collection Time: 04/18/17 10:45 PM  Result Value Ref Range Status   Specimen Description BLOOD RIGHT ANTECUBITAL  Final   Special Requests   Final    BOTTLES DRAWN AEROBIC AND ANAEROBIC Blood Culture adequate volume   Culture PENDING  Incomplete   Report Status PENDING  Incomplete      Radiology Studies: Dg Chest 2 View Result Date: 04/18/2017 1. Posterior left lower lobe opacity that is most likely atelectasis. Infection is possible. This is similar to what was present on the prior radiographs. 2. No other abnormalities.   Ct Soft Tissue Neck W Contrast Result Date: 04/19/2017 1. Multiple dental caries and periapical lucencies. Dental disease involving teeth 17 and 18 is adjacent to very mild lower left facial fat infiltration, possibly indicating odontogenic cellulitis in the appropriate clinical context. 2. Multiple mildly enlarged reactive cervical lymph nodes. 3. Enlarged adenoid and palatine tonsils without abscess or drainable fluid collection. In this setting, this is likely reactive lymphatic tissue.  Correlate for symptoms of tonsillopharyngitis.   Ct Chest W Contrast Result Date: 04/19/2017 1. Small left pleural effusion with adjacent compressive atelectasis. 2. No large central pulmonary embolus. 3. No evidence of septic emboli.   Ct Shoulder Left W Contrast Result Date: 04/19/2017 1. Unremarkable CT of the left shoulder. No evidence of fracture, bone destruction or bursitis. 2. Small left pleural effusion with compressive atelectasis. The possibility of a parapneumonic effusion with pulmonary consolidation in the left lower lobe given history of fever is not entirely excluded. Electronically Signed   By: Ashley Royalty M.D.   On: 04/19/2017 03:02   Dg Shoulder Left Result Date: 04/18/2017 Negative. Electronically Signed   By: Elon Alas M.D.   On: 04/18/2017 23:47      Scheduled Meds: . enoxaparin (LOVENOX) injection  40 mg Subcutaneous Q24H  . ibuprofen  400 mg Oral Q6H  . nicotine  21 mg Transdermal Daily   Continuous Infusions: . sodium chloride 125 mL/hr at 04/19/17 0441  . piperacillin-tazobactam (ZOSYN)  IV 3.375 g (04/19/17 0553)  . vancomycin Stopped (04/19/17 0653)     LOS: 0 days    Time spent: 25 minutes  Greater than 50% of the time spent on counseling and coordinating the care.   Leisa Lenz, MD [facility] Pager [phone]  If 7PM-7AM, please contact night-coverage [email or web address] Password [email or web address] 04/19/2017, 8:14 AM

## 2017-04-19 NOTE — ED Notes (Signed)
Meal tray ordered for patient.

## 2017-04-19 NOTE — Progress Notes (Signed)
Pharmacy Antibiotic Note  Kevin Ashley is a 31 y.o. male w/ prior h/o IVDU admitted on 04/18/2017 with concern for possible endocarditis and septic joint.  Pharmacy has been consulted for Vancocin and Zosyn dosing.  Plan: Vancomycin  IV every 8 hours.  Goal trough 15-20 mcg/mL. Zosyn 3.375g IV q8h (4 hour infusion).  Height:  (172.7 cm) Weight: 169 lb 15.6 oz (77.1 kg) IBW/kg (Calculated) : 68.4  Temp (24hrs), Avg:101.9 F (38.8 C), Min:101.9 F (38.8 C), Max:101.9 F (38.8 C)   Recent Labs Lab 04/18/17 1545 04/18/17 1559 04/18/17 1909  WBC 11.2*  --   --   CREATININE 0.53*  --   --   LATICACIDVEN  --  1.44 0.70    Estimated Creatinine Clearance: 133 mL/min (A) (by C-G formula based on SCr of 0.53 mg/dL (L)).    No Known Allergies   Thank you for allowing pharmacy to be a part of this patient's care.  Vernard Gambles, PharmD, BCPS  04/19/2017 12:06 AM

## 2017-04-19 NOTE — ED Notes (Signed)
Patient transported to CT 

## 2017-04-20 DIAGNOSIS — A419 Sepsis, unspecified organism: Principal | ICD-10-CM

## 2017-04-20 LAB — BASIC METABOLIC PANEL
ANION GAP: 5 (ref 5–15)
BUN: 7 mg/dL (ref 6–20)
CHLORIDE: 110 mmol/L (ref 101–111)
CO2: 24 mmol/L (ref 22–32)
Calcium: 8.6 mg/dL — ABNORMAL LOW (ref 8.9–10.3)
Creatinine, Ser: 0.58 mg/dL — ABNORMAL LOW (ref 0.61–1.24)
GFR calc Af Amer: >60 mL/min (ref >60)
GFR calc non Af Amer: >60 mL/min (ref >60)
GLUCOSE: 113 mg/dL — AB (ref 65–99)
POTASSIUM: 3.5 mmol/L (ref 3.5–5.1)
Sodium: 139 mmol/L (ref 135–145)

## 2017-04-20 LAB — CBC
HEMATOCRIT: 32.7 % — AB (ref 39.0–52.0)
Hemoglobin: 10.8 g/dL — ABNORMAL LOW (ref 13.0–17.0)
MCH: 29.1 pg (ref 26.0–34.0)
MCHC: 33 g/dL (ref 30.0–36.0)
MCV: 88.1 fL (ref 78.0–100.0)
Platelets: 294 10*3/uL (ref 150–400)
RBC: 3.71 MIL/uL — AB (ref 4.22–5.81)
RDW: 13.4 % (ref 11.5–15.5)
WBC: 9.6 10*3/uL (ref 4.0–10.5)

## 2017-04-20 LAB — GLUCOSE, CAPILLARY: Glucose-Capillary: 98 mg/dL (ref 65–99)

## 2017-04-20 MED ORDER — INFLUENZA VAC SPLIT QUAD 0.5 ML IM SUSY: 0.5000 mL | PREFILLED_SYRINGE | INTRAMUSCULAR | Status: DC

## 2017-04-20 NOTE — Consult Note (Signed)
Referring Physician: Dr. Verneita Griffes, MD  Kevin Ashley is an 31 y.o. male.                       Chief Complaint: Bacteremia in patient with drug use history, dental caries and shoulder pain  HPI: 31 year old male presented with chest pain, left shoulder pain, fever and chills x 1 week. Patient has h/o IV drug abuse, fever, elevated WBC count and possible sepsis. Consult is for TEE to r/o endocarditis.  Past Medical History:  Diagnosis Date  . IVDU (intravenous drug user)   . Marijuana abuse   . Tobacco abuse       History reviewed. No pertinent surgical history.  Family History  Problem Relation Age of Onset  . COPD Mother   . Valvular heart disease Brother    Social History:  reports that he has been smoking Cigarettes.  He has been smoking about 0.50 packs per day. He has never used smokeless tobacco. He reports that he uses drugs, including Marijuana. He reports that he does not drink alcohol.  Allergies: No Known Allergies  Medications Prior to Admission  Medication Sig Dispense Refill  . IBUPROFEN PO Take 2 tablets by mouth 2 (two) times daily as needed (pain).      Results for orders placed or performed during the hospital encounter of 04/18/17 (from the past 48 hour(s))  I-stat troponin, ED     Status: None   Collection Time: 04/18/17  7:07 PM  Result Value Ref Range   Troponin i, poc 0.00 0.00 - 0.08 ng/mL   Comment 3            Comment: Due to the release kinetics of cTnI, a negative result within the first hours of the onset of symptoms does not rule out myocardial infarction with certainty. If myocardial infarction is still suspected, repeat the test at appropriate intervals.   I-Stat CG4 Lactic Acid, ED     Status: None   Collection Time: 04/18/17  7:09 PM  Result Value Ref Range   Lactic Acid, Venous 0.70 0.5 - 1.9 mmol/L  Urinalysis, Routine w reflex microscopic     Status: Abnormal   Collection Time: 04/18/17  7:27 PM  Result Value Ref Range   Color, Urine STRAW (A) YELLOW   APPearance CLEAR CLEAR   Specific Gravity, Urine 1.002 (L) 1.005 - 1.030   pH 6.0 5.0 - 8.0   Glucose, UA NEGATIVE NEGATIVE mg/dL   Hgb urine dipstick NEGATIVE NEGATIVE   Bilirubin Urine NEGATIVE NEGATIVE   Ketones, ur NEGATIVE NEGATIVE mg/dL   Protein, ur NEGATIVE NEGATIVE mg/dL   Nitrite NEGATIVE NEGATIVE   Leukocytes, UA NEGATIVE NEGATIVE  Urine rapid drug screen (hosp performed)     Status: Abnormal   Collection Time: 04/18/17  7:27 PM  Result Value Ref Range   Opiates NONE DETECTED NONE DETECTED   Cocaine NONE DETECTED NONE DETECTED   Benzodiazepines NONE DETECTED NONE DETECTED   Amphetamines NONE DETECTED NONE DETECTED   Tetrahydrocannabinol POSITIVE (A) NONE DETECTED   Barbiturates NONE DETECTED NONE DETECTED    Comment:        DRUG SCREEN FOR MEDICAL PURPOSES ONLY.  IF CONFIRMATION IS NEEDED FOR ANY PURPOSE, NOTIFY LAB WITHIN 5 DAYS.        LOWEST DETECTABLE LIMITS FOR URINE DRUG SCREEN Drug Class       Cutoff (ng/mL) Amphetamine      1000 Barbiturate  200 Benzodiazepine   194 Tricyclics       174 Opiates          300 Cocaine          300 THC              50   Culture, blood (routine x 2)     Status: None (Preliminary result)   Collection Time: 04/18/17 10:45 PM  Result Value Ref Range   Specimen Description BLOOD RIGHT ANTECUBITAL    Special Requests      BOTTLES DRAWN AEROBIC AND ANAEROBIC Blood Culture adequate volume   Culture PENDING    Report Status PENDING   Culture, blood (routine x 2)     Status: None (Preliminary result)   Collection Time: 04/18/17 11:00 PM  Result Value Ref Range   Specimen Description BLOOD LEFT ANTECUBITAL    Special Requests IN PEDIATRIC BOTTLE Blood Culture adequate volume    Culture NO GROWTH 1 DAY    Report Status PENDING   Magnesium     Status: Abnormal   Collection Time: 04/19/17  4:06 AM  Result Value Ref Range   Magnesium 1.5 (L) 1.7 - 2.4 mg/dL  HIV antibody (Routine Testing)      Status: None   Collection Time: 04/19/17  4:06 AM  Result Value Ref Range   HIV Screen 4th Generation wRfx Non Reactive Non Reactive    Comment: (NOTE) Performed At: [facility] [place], [place] [id number] Lindon Romp MD UD:[id number]   Basic metabolic panel     Status: Abnormal   Collection Time: 04/19/17  4:06 AM  Result Value Ref Range   Sodium 138 135 - 145 mmol/L   Potassium 3.5 3.5 - 5.1 mmol/L   Chloride 108 101 - 111 mmol/L   CO2 23 22 - 32 mmol/L   Glucose, Bld 95 65 - 99 mg/dL   BUN 5 (L) 6 - 20 mg/dL   Creatinine, Ser 0.51 (L) 0.61 - 1.24 mg/dL   Calcium 7.9 (L) 8.9 - 10.3 mg/dL   GFR calc non Af Amer >60 >60 mL/min   GFR calc Af Amer >60 >60 mL/min    Comment: (NOTE) The eGFR has been calculated using the CKD EPI equation. This calculation has not been validated in all clinical situations. eGFR's persistently <60 mL/min signify possible Chronic Kidney Disease.    Anion gap 7 5 - 15  CBC     Status: Abnormal   Collection Time: 04/19/17  4:06 AM  Result Value Ref Range   WBC 8.5 4.0 - 10.5 K/uL   RBC 3.50 (L) 4.22 - 5.81 MIL/uL   Hemoglobin 10.2 (L) 13.0 - 17.0 g/dL   HCT 31.1 (L) 39.0 - 52.0 %   MCV 88.9 78.0 - 100.0 fL   MCH 29.1 26.0 - 34.0 pg   MCHC 32.8 30.0 - 36.0 g/dL   RDW 13.6 11.5 - 15.5 %   Platelets 228 150 - 400 K/uL  Lactic acid, plasma     Status: None   Collection Time: 04/19/17  4:06 AM  Result Value Ref Range   Lactic Acid, Venous 0.8 0.5 - 1.9 mmol/L  Procalcitonin     Status: None   Collection Time: 04/19/17  4:06 AM  Result Value Ref Range   Procalcitonin 0.58 ng/mL    Comment:        Interpretation: PCT > 0.5 ng/mL and <= 2 ng/mL: Systemic infection (sepsis) is possible, but other conditions are  known to elevate PCT as well. (NOTE)         ICU PCT Algorithm               Non ICU PCT Algorithm    ----------------------------     ------------------------------         PCT < 0.25 ng/mL                  PCT < 0.1 ng/mL     Stopping of antibiotics            Stopping of antibiotics       strongly encouraged.               strongly encouraged.    ----------------------------     ------------------------------       PCT level decrease by               PCT < 0.25 ng/mL       >= 80% from peak PCT       OR PCT 0.25 - 0.5 ng/mL          Stopping of antibiotics                                             encouraged.     Stopping of antibiotics           encouraged.    ----------------------------     ------------------------------       PCT level decrease by              PCT >= 0.25 ng/mL       < 80% from peak PCT        AND PCT >= 0.5 ng/mL             Continuing antibiotics                                              encouraged.       Continuing antibiotics            encouraged.    ----------------------------     ------------------------------     PCT level increase compared          PCT > 0.5 ng/mL         with peak PCT AND          PCT >= 0.5 ng/mL             Escalation of antibiotics                                          strongly encouraged.      Escalation of antibiotics        strongly encouraged.   Protime-INR     Status: None   Collection Time: 04/19/17  4:06 AM  Result Value Ref Range   Prothrombin Time 14.5 11.4 - 15.2 seconds   INR 1.13   APTT     Status: Abnormal   Collection Time: 04/19/17  4:06 AM  Result Value Ref Range   aPTT 77 (H) 24 - 36 seconds    Comment:  IF BASELINE aPTT IS ELEVATED, SUGGEST PATIENT RISK ASSESSMENT BE USED TO DETERMINE APPROPRIATE ANTICOAGULANT THERAPY.   C-reactive protein     Status: Abnormal   Collection Time: 04/19/17  4:06 AM  Result Value Ref Range   CRP 9.3 (H) <1.0 mg/dL  Sedimentation rate     Status: Abnormal   Collection Time: 04/19/17  4:06 AM  Result Value Ref Range   Sed Rate 84 (H) 0 - 16 mm/hr  I-stat troponin, ED     Status: None   Collection Time: 04/19/17  4:19 AM  Result Value Ref Range   Troponin  i, poc 0.00 0.00 - 0.08 ng/mL   Comment 3            Comment: Due to the release kinetics of cTnI, a negative result within the first hours of the onset of symptoms does not rule out myocardial infarction with certainty. If myocardial infarction is still suspected, repeat the test at appropriate intervals.   CBG monitoring, ED     Status: Abnormal   Collection Time: 04/19/17  8:08 AM  Result Value Ref Range   Glucose-Capillary 114 (H) 65 - 99 mg/dL  CBC     Status: Abnormal   Collection Time: 04/20/17  7:39 AM  Result Value Ref Range   WBC 9.6 4.0 - 10.5 K/uL   RBC 3.71 (L) 4.22 - 5.81 MIL/uL   Hemoglobin 10.8 (L) 13.0 - 17.0 g/dL   HCT 32.7 (L) 39.0 - 52.0 %   MCV 88.1 78.0 - 100.0 fL   MCH 29.1 26.0 - 34.0 pg   MCHC 33.0 30.0 - 36.0 g/dL   RDW 13.4 11.5 - 15.5 %   Platelets 294 150 - 400 K/uL  Basic metabolic panel     Status: Abnormal   Collection Time: 04/20/17  7:39 AM  Result Value Ref Range   Sodium 139 135 - 145 mmol/L   Potassium 3.5 3.5 - 5.1 mmol/L   Chloride 110 101 - 111 mmol/L   CO2 24 22 - 32 mmol/L   Glucose, Bld 113 (H) 65 - 99 mg/dL   BUN 7 6 - 20 mg/dL   Creatinine, Ser 0.58 (L) 0.61 - 1.24 mg/dL   Calcium 8.6 (L) 8.9 - 10.3 mg/dL   GFR calc non Af Amer >60 >60 mL/min   GFR calc Af Amer >60 >60 mL/min    Comment: (NOTE) The eGFR has been calculated using the CKD EPI equation. This calculation has not been validated in all clinical situations. eGFR's persistently <60 mL/min signify possible Chronic Kidney Disease.    Anion gap 5 5 - 15  Glucose, capillary     Status: None   Collection Time: 04/20/17  8:20 AM  Result Value Ref Range   Glucose-Capillary 98 65 - 99 mg/dL   Ct Soft Tissue Neck W Contrast  Result Date: 04/19/2017 CLINICAL DATA:  Sepsis and left neck pain.  History of IV drug use. EXAM: CT NECK WITH CONTRAST TECHNIQUE: Multidetector CT imaging of the neck was performed using the standard protocol following the bolus administration of  intravenous contrast. CONTRAST:  75 mL Isovue-300 COMPARISON:  None. FINDINGS: Pharynx and larynx: --Nasopharynx: Mild hypertrophy of the adenoid tonsils. --Oral cavity and oropharynx: Enlarged palatine tonsils. No fluid collection. --Hypopharynx: Normal vallecula and pyriform sinuses. --Larynx: Normal epiglottis and pre-epiglottic space. Normal aryepiglottic and vocal folds. --Retropharyngeal space: No abscess, effusion or lymphadenopathy. Salivary glands: --Parotid: No mass lesion or inflammation. No sialolithiasis or ductal dilatation. --Submandibular: Symmetric  without inflammation. No sialolithiasis or ductal dilatation. --Sublingual: Normal. No ranula or other visible lesion of the base of tongue and floor of mouth. Thyroid: Normal. Lymph nodes: Level 1A lymph node measures 5 mm. Left level 1B node measures 8 mm. Right level 5A node measures 10 mm. Vascular: Major cervical vessels are patent. Limited intracranial: Normal. Visualized orbits: Normal. Mastoids and visualized paranasal sinuses: No fluid levels or advanced mucosal thickening. Mild ethmoid and maxillary mucosal thickening. No mastoid effusion. Skeleton: No bony spinal canal stenosis. No lytic or blastic lesions. Upper chest: Clear. Other: There are multiple periapical lucencies, including teeth 17, 18, 25, 29, 30 and 31. There are numerous dental caries. There is mild left facial skin thickening and thickening of the platysma muscle. IMPRESSION: 1. Multiple dental caries and periapical lucencies. Dental disease involving teeth 17 and 18 is adjacent to very mild lower left facial fat infiltration, possibly indicating odontogenic cellulitis in the appropriate clinical context. 2. Multiple mildly enlarged reactive cervical lymph nodes. 3. Enlarged adenoid and palatine tonsils without abscess or drainable fluid collection. In this setting, this is likely reactive lymphatic tissue. Correlate for symptoms of tonsillopharyngitis. Electronically Signed    By: Deatra Robinson M.D.   On: 04/19/2017 02:58   Ct Chest W Contrast  Result Date: 04/19/2017 CLINICAL DATA:  Sepsis, pleuritic chest pain, history of IVDA EXAM: CT CHEST WITH CONTRAST TECHNIQUE: Multidetector CT imaging of the chest was performed during intravenous contrast administration. CONTRAST:  5mL ISOVUE-300 IOPAMIDOL (ISOVUE-300) INJECTION 61% COMPARISON:  CXR 04/18/2017 FINDINGS: Cardiovascular: No significant vascular findings. Normal heart size. No pericardial effusion. Adequate opacification of the central pulmonary arteries to the lobar level. No large central pulmonary embolus. No aortic aneurysm or dissection. Mediastinum/Nodes: No enlarged mediastinal, hilar, or axillary lymph nodes. Thyroid gland, trachea, and esophagus demonstrate no significant findings. Lungs/Pleura: Small left pleural effusion with adjacent compressive atelectasis. No evidence of septic emboli. Upper Abdomen: No acute abnormality. Musculoskeletal: No chest wall abnormality. No acute or significant osseous findings. IMPRESSION: 1. Small left pleural effusion with adjacent compressive atelectasis. 2. No large central pulmonary embolus. 3. No evidence of septic emboli. Electronically Signed   By: Tollie Eth M.D.   On: 04/19/2017 02:51   Ct Shoulder Left W Contrast  Result Date: 04/19/2017 CLINICAL DATA:  Six days of left shoulder pain and neck pain. Diagnostic pericarditis and osteochondritis. Pain is worsened with fever today. EXAM: CT OF THE UPPER LEFT EXTREMITY WITH CONTRAST TECHNIQUE: Multidetector CT imaging of the upper left extremity was performed according to the standard protocol following intravenous contrast administration. COMPARISON:  Same day chest CT. Shoulder radiographs from 04/18/2017. CONTRAST:  75 cc of Isovue-300 as part of the same day chest CT. FINDINGS: Bones/Joint/Cartilage [facility] and glenohumeral joints are maintained without abnormal joint effusion, fracture nor bone destruction. The included  sternum, upper thoracic spine and ribs are nonacute. Ligaments Suboptimally assessed by CT. Muscles and Tendons Negative Soft tissues Small left pleural effusion with compressive atelectasis. The possibility of a pneumonic consolidation with parapneumonic effusion is not entirely excluded. IMPRESSION: 1. Unremarkable CT of the left shoulder. No evidence of fracture, bone destruction or bursitis. 2. Small left pleural effusion with compressive atelectasis. The possibility of a parapneumonic effusion with pulmonary consolidation in the left lower lobe given history of fever is not entirely excluded. Electronically Signed   By: Tollie Eth M.D.   On: 04/19/2017 03:02   Dg Shoulder Left  Result Date: 04/18/2017 CLINICAL DATA:  Pain for 6 days.  Unable to lift arm. EXAM: LEFT SHOULDER - 2+ VIEW COMPARISON:  None. FINDINGS: The humeral head is well-formed and located. The subacromial, glenohumeral and acromioclavicular joint spaces are intact. No destructive bony lesions. Soft tissue planes are non-suspicious. IMPRESSION: Negative. Electronically Signed   By: Elon Alas M.D.   On: 04/18/2017 23:47    Review Of Systems Constitutional: Positive fever, chills, no weight loss or gain. Eyes: No vision change, wears glasses. No discharge or pain. Ears: No hearing loss, No tinnitus. Respiratory: No asthma, COPD, pneumonias. No shortness of breath. No hemoptysis. Cardiovascular: No chest pain, palpitation, leg edema. Gastrointestinal: No nausea, vomiting, diarrhea, constipation. No GI bleed. No hepatitis. Genitourinary: No dysuria, hematuria, kidney stone. No incontinance. Neurological: No headache, stroke, seizures.  Psychiatry: No psych facility admission for anxiety, depression, suicide. No detox. Skin: No rash. Musculoskeletal: Positive joint pain, no fibromyalgia. No neck pain, back pain. Lymphadenopathy: No lymphadenopathy. Hematology: No anemia or easy bruising.   Blood pressure 116/76, pulse  (!) 42, temperature 98.3 F (36.8 C), temperature source Oral, resp. rate 17, height '5\' 8"'$  (1.727 m), weight 75.4 kg (166 lb 3.2 oz), SpO2 100 %. Body mass index is 25.27 kg/m. General appearance: alert, cooperative, appears stated age and no distress Head: Normocephalic, atraumatic. Eyes: pink conjunctiva, corneas clear. PERRL, EOM's intact. Neck: No adenopathy, no carotid bruit, no JVD, supple, symmetrical, trachea midline and thyroid not enlarged. Resp: Clear to auscultation bilaterally. Cardio: Regular rate and rhythm, S1, S2 normal, II/VI systolic murmur, no click, rub or gallop GI: Soft, non-tender; bowel sounds normal; no organomegaly. Extremities: No edema, cyanosis or clubbing. Skin: Warm and dry.  Neurologic: Alert and oriented X 3, normal strength. Normal coordination and gait.  Assessment/Plan Bacteremia R/O endocarditis Possible pneumonia Multiple dental caries Left shoulder pain H/O drug use/abuse  TEE in AM. Patient understood risks and procedure.  Birdie Riddle, MD  04/20/2017, 6:57 PM

## 2017-04-20 NOTE — Progress Notes (Signed)
Patient ID: Kevin Ashley, male   DOB: 1985-09-05, 31 y.o.   MRN: [id number]  PROGRESS NOTE    Kevin Ashley  OHY:[id number] DOB: Apr 18, 1986 DOA: 04/18/2017  PCP: Patient, No Pcp Per   Brief Narrative:  31 year old male with history of IV drug use, marijuana and tobacco abuse who presented to ED with chest pain, left shoulder and neck pain, fever and chills for past week or so prior to this admission. Chest pain is located in the mid sternal area, 8 out of 10 in severity, pleuritic in nature, radiating to shoulder and neck. Patient was in [facility] about 2 days ago and was diagnosed with possible pericarditis and costochondritis and he was started on ibuprofen. His symptoms did not improve.  On admission, blood pressure was stable. Patient was febrile with temperature of 101.71F, tachycardic, tachypneic. His blood work was significant for mild leukocytosis of 11.2, hemoglobin 11.2, potassium 3.3, normal lactic acid. Chest x-ray showed posterior left lower lobe opacity most likely atelectasis or possible infection. CT chest showed small left pleural effusion and adjacent compressive atelectasis but no large central pulmonary embolus or septic emboli. CT shoulder showed no evidence of fracture or bone destruction but it did show possible parapneumonic effusion with pulmonary consolidation in the left lower lobe. Sepsis criteria met on the admission and patient was started on vancomycin and Zosyn while awaiting culture results.   Assessment & Plan:   Principal Problem:   Sepsis ([place]) / Possible parapneumonic effusion / Leukocytosis  - Sepsis criteria met on admission with fever, tachycardia, tachypnea, leukocytosis - Lactic acid is WNL - UA unremarkable  - CXR showed posterior left lower lobe opacity that is most likely atelectasis. Infection is possible. - CT shoulder with possible parapneumonic effusion with pulmonary consolidation in the left lower lobe - Blood cultures and urine  culture ordered - pending  - Started broad spectrum antibiotics: vanco and zosyn - Rule out possible endocarditis, TTE ordered for now -blood cultures are still pending however I Dr. Doylene Canard will be seeing the patient in consult for possible TEE to rule out source of infectionand this is scheduled for tomorrow 9/26 -We will repeat chest x-ray two-view 9/26 am  Active Problems:   IVDU (intravenous drug user) / tobacco abuse / marijuana abuse - Counseled on drug abuseand abstinence    Chest pain / Left shoulder and neck pain / Possible odontogenic cellulitis - Likely in the setting of sepsis and acute infectious process - CT scan of the neck showed multiple dental caries and periapical lucencies. Dental disease involving teeth 17 and 18 is adjacent to very mild lower left facial fat infiltration, possibly indicating odontogenic cellulitis in the appropriate clinical context.  Multiple mildly enlarged reactive cervical lymph nodes.  Enlarged adenoid and palatine tonsils without abscess or drainable fluid collection. In this setting, this is likely reactive lymphatic tissue. Correlate for symptoms of tonsillopharyngitis.  - CT chest showed small left pleural effusion with adjacent compressive atelectasis.  No large central pulmonary embolus. No evidence of septic emboli.  - CT shoulder showed no evidence of fracture, bone destruction or bursitis. - Pt already getting abx for sepsis and can potentially de-escalate off of broad spectrum antibiotics once  to potential oral antibiotics    Hypokalemia - Due to sepsis - Supplemented and WNL  DVT prophylaxis: Lovenox subcutaneous Code Status: full code  Family Communication: Family at the bedside Disposition Plan: Not yet stable for discharge, receiving antibiotics for sepsis and possible endocarditis  Consultants:   Cardiology   Procedures:  TTE  Impressions:  - Normal LV size and systolic function, EF 32-99%. Normal diastolic   function.  Normal RV size and systolic function. There was mild   pulmonary hypertension noted and a dilated IVC.   Antimicrobials:   Vanco and zosyn 04/18/2017 -_>    Subjective: Has left shoulder pain. Is able however to lift his left shoulder above his head now where he couldn't 2 days ago-ibuprofen seems to help the most he has when necessary medications for pain beyond that as well He is eating and drinking Did pass a stool yesterday  Objective: Vitals:   04/19/17 1500 04/19/17 1805 04/19/17 1931 04/20/17 0535  BP: 125/78 129/87 128/85 128/77  Pulse:  (!) 48 (!) 46 (!) 44  Resp: (!) '22 16 17 17  '$ Temp:  98.8 F (37.1 C) 98.6 F (37 C) 98.5 F (36.9 C)  TempSrc:  Oral Oral Oral  SpO2:  100% 99% 99%  Weight:   75.4 kg (166 lb 3.2 oz)   Height:   '5\' 8"'$  (1.727 m)     Intake/Output Summary (Last 24 hours) at 04/20/17 0847 Last data filed at 04/20/17 0645  Gross per 24 hour  Intake              770 ml  Output              750 ml  Net               20 ml   Filed Weights   04/19/17 0000 04/19/17 1931  Weight: 77.1 kg (169 lb 15.6 oz) 75.4 kg (166 lb 3.2 oz)    Examination:  Disheveled pleasant No icterus no pallor No submandibular lymphadenopathy No murmur rub or gallop Abdomen soft nontender no rebound S1-S2 no notable murmur No rales no rhonchi No lower extremity edema Psychiatric flat affect  Data Reviewed: I have personally reviewed following labs and imaging studies  CBC:  Recent Labs Lab 04/18/17 1545 04/19/17 0406 04/20/17 0739  WBC 11.2* 8.5 9.6  NEUTROABS 9.7*  --   --   HGB 11.2* 10.2* 10.8*  HCT 33.2* 31.1* 32.7*  MCV 87.8 88.9 88.1  PLT 344 228 242   Basic Metabolic Panel:  Recent Labs Lab 04/18/17 1545 04/19/17 0406  NA 137 138  K 3.3* 3.5  CL 105 108  CO2 24 23  GLUCOSE 103* 95  BUN 7 5*  CREATININE 0.53* 0.51*  CALCIUM 8.8* 7.9*  MG  --  1.5*   GFR: Estimated Creatinine Clearance: 133 mL/min (A) (by C-G formula based on SCr of  0.51 mg/dL (L)). Liver Function Tests:  Recent Labs Lab 04/18/17 1545  AST 16  ALT 7*  ALKPHOS 57  BILITOT 0.9  PROT 7.0  ALBUMIN 3.0*   No results for input(s): LIPASE, AMYLASE in the last 168 hours. No results for input(s): AMMONIA in the last 168 hours. Coagulation Profile:  Recent Labs Lab 04/19/17 0406  INR 1.13   Cardiac Enzymes: No results for input(s): CKTOTAL, CKMB, CKMBINDEX, TROPONINI in the last 168 hours. BNP (last 3 results) No results for input(s): PROBNP in the last 8760 hours. HbA1C: No results for input(s): HGBA1C in the last 72 hours. CBG:  Recent Labs Lab 04/19/17 0808 04/20/17 0820  GLUCAP 114* 98   Lipid Profile: No results for input(s): CHOL, HDL, LDLCALC, TRIG, CHOLHDL, LDLDIRECT in the last 72 hours. Thyroid Function Tests: No results for input(s): TSH, T4TOTAL, FREET4,  T3FREE, THYROIDAB in the last 72 hours. Anemia Panel: No results for input(s): VITAMINB12, FOLATE, FERRITIN, TIBC, IRON, RETICCTPCT in the last 72 hours. Urine analysis:    Component Value Date/Time   COLORURINE STRAW (A) 04/18/2017 1927   APPEARANCEUR CLEAR 04/18/2017 1927   LABSPEC 1.002 (L) 04/18/2017 1927   PHURINE 6.0 04/18/2017 1927   GLUCOSEU NEGATIVE 04/18/2017 1927   HGBUR NEGATIVE 04/18/2017 1927   BILIRUBINUR NEGATIVE 04/18/2017 1927   KETONESUR NEGATIVE 04/18/2017 1927   PROTEINUR NEGATIVE 04/18/2017 1927   NITRITE NEGATIVE 04/18/2017 1927   LEUKOCYTESUR NEGATIVE 04/18/2017 1927   Sepsis Labs: '@LABRCNTIP'$ (procalcitonin:4,lacticidven:4)    Recent Results (from the past 240 hour(s))  Culture, blood (routine x 2)     Status: None (Preliminary result)   Collection Time: 04/18/17 10:45 PM  Result Value Ref Range Status   Specimen Description BLOOD RIGHT ANTECUBITAL  Final   Special Requests   Final    BOTTLES DRAWN AEROBIC AND ANAEROBIC Blood Culture adequate volume   Culture PENDING  Incomplete   Report Status PENDING  Incomplete      Radiology  Studies: Dg Chest 2 View Result Date: 04/18/2017 1. Posterior left lower lobe opacity that is most likely atelectasis. Infection is possible. This is similar to what was present on the prior radiographs. 2. No other abnormalities.   Ct Soft Tissue Neck W Contrast Result Date: 04/19/2017 1. Multiple dental caries and periapical lucencies. Dental disease involving teeth 17 and 18 is adjacent to very mild lower left facial fat infiltration, possibly indicating odontogenic cellulitis in the appropriate clinical context. 2. Multiple mildly enlarged reactive cervical lymph nodes. 3. Enlarged adenoid and palatine tonsils without abscess or drainable fluid collection. In this setting, this is likely reactive lymphatic tissue. Correlate for symptoms of tonsillopharyngitis.   Ct Chest W Contrast Result Date: 04/19/2017 1. Small left pleural effusion with adjacent compressive atelectasis. 2. No large central pulmonary embolus. 3. No evidence of septic emboli.   Ct Shoulder Left W Contrast Result Date: 04/19/2017 1. Unremarkable CT of the left shoulder. No evidence of fracture, bone destruction or bursitis. 2. Small left pleural effusion with compressive atelectasis. The possibility of a parapneumonic effusion with pulmonary consolidation in the left lower lobe given history of fever is not entirely excluded. Electronically Signed   By: Ashley Royalty M.D.   On: 04/19/2017 03:02   Dg Shoulder Left Result Date: 04/18/2017 Negative. Electronically Signed   By: Elon Alas M.D.   On: 04/18/2017 23:47      Scheduled Meds: . enoxaparin (LOVENOX) injection  40 mg Subcutaneous Q24H  . ibuprofen  400 mg Oral Q6H  . nicotine  21 mg Transdermal Daily   Continuous Infusions: . sodium chloride 125 mL/hr at 04/20/17 0215  . piperacillin-tazobactam (ZOSYN)  IV 3.375 g (04/20/17 0512)  . vancomycin Stopped (04/20/17 0612)     LOS: 1 day    Time spent: 25 minutes  Greater than 50% of the time spent on  counseling and coordinating the care.   Nita Sells, MD [facility] Pager [phone]  If 7PM-7AM, please contact night-coverage [email or web address] Password [email or web address] 04/20/2017, 8:47 AM

## 2017-04-20 NOTE — Progress Notes (Signed)
Pt has been running sinus brady on the heart heart monitor on call physician X Blount notified did not give any new orders, this morning pt c/o SOB O2 sat 99   I have kept pt on 1L of O2 nasal canula for comfort, all other vital signs are stable will conitnue to monitor  will continue to monitor pt

## 2017-04-21 ENCOUNTER — Inpatient Hospital Stay (HOSPITAL_COMMUNITY): Payer: Self-pay | Admitting: Certified Registered Nurse Anesthetist

## 2017-04-21 ENCOUNTER — Encounter (HOSPITAL_COMMUNITY): Payer: Self-pay | Admitting: *Deleted

## 2017-04-21 ENCOUNTER — Encounter (HOSPITAL_COMMUNITY)
Admission: EM | Disposition: A | Discharge status: Home / Self Care | Payer: Self-pay | Source: Home / Self Care | Attending: Family Medicine

## 2017-04-21 ENCOUNTER — Inpatient Hospital Stay (HOSPITAL_COMMUNITY): Payer: Self-pay

## 2017-04-21 HISTORY — PX: TEE WITHOUT CARDIOVERSION: SHX5443

## 2017-04-21 LAB — GLUCOSE, CAPILLARY: Glucose-Capillary: 110 mg/dL — ABNORMAL HIGH (ref 65–99)

## 2017-04-21 LAB — VANCOMYCIN, TROUGH: Vancomycin Tr: 16 ug/mL (ref 15–20)

## 2017-04-21 SURGERY — ECHOCARDIOGRAM, TRANSESOPHAGEAL
Anesthesia: Monitor Anesthesia Care

## 2017-04-21 MED ORDER — PROPOFOL 10 MG/ML IV BOLUS
INTRAVENOUS | Status: DC | PRN
  Administered 2017-04-21: 50 mg via INTRAVENOUS

## 2017-04-21 MED ORDER — BUTAMBEN-TETRACAINE-BENZOCAINE 2-2-14 % EX AERO
INHALATION_SPRAY | CUTANEOUS | Status: DC | PRN
  Administered 2017-04-21: 1 via TOPICAL

## 2017-04-21 MED ORDER — LACTATED RINGERS IV SOLN
INTRAVENOUS | Status: DC
  Administered 2017-04-21: 1000 mL via INTRAVENOUS

## 2017-04-21 MED ORDER — SODIUM CHLORIDE 0.9 % IV SOLN: INTRAVENOUS | Status: DC

## 2017-04-21 MED ORDER — PROPOFOL 500 MG/50ML IV EMUL
INTRAVENOUS | Status: DC | PRN
  Administered 2017-04-21: 150 ug/kg/min via INTRAVENOUS

## 2017-04-21 NOTE — Discharge Summary (Signed)
Physician Discharge Summary  Kevin Ashley:[id number] DOB: 19-May-1986 DOA: 04/18/2017  PCP: Patient, No Pcp Per  Admit date: 04/18/2017 Discharge date: 04/21/2017  Time spent: 35 minutes  Recommendations for Outpatient Follow-up:  1. Would recommend follow-up with primary care physician 2. Would recommend substance abuse counseling if patient is willing  Discharge Diagnoses:  Principal Problem:   Sepsis ([facility]) Active Problems:   IVDU (intravenous drug user)   Tobacco abuse   Marijuana abuse   Hypokalemia   Chest pain   Left shoulder pain   Neck pain on left side  Final diagnosis unclear he had a transient fever and we are not sure if he had  Septic arthritis or not on discharge -no further clarification will be given  Discharge Condition: guarded  Diet recommendation: regular  Filed Weights   04/19/17 0000 04/19/17 1931 04/21/17 0925  Weight: 77.1 kg (169 lb 15.6 oz) 75.4 kg (166 lb 3.2 oz) 75.3 kg (166 lb)    History of present illness:  This 31 year old male was admitted on09/23/2018 with chest pain and left shoulder pain and left neck pain and fever and chills and it was felt that he might of had initially a septic shoulder or an endocarditis when he presented Blood cultures were drawn on admission 9/23 which did not grow anything till date however patient decided to leave AGAINST MEDICAL ADVICE and would not wait for further management decisions regarding his care A TEE was performed on 9/26 and this was negative for any vegetations his PICC line was pulled and vancomycin and Zosyn were discontinued after my discussion with infectious disease physician Dr. Florentina Addison was no recommendation for antibiotics at discharge given he had not speciate it anything on his blood cultures and I have encouraged the patient to call back the hospital and see what the cultures grow as he is insistent on going home    Procedures:  TEE 04/21/2017    Consultations:  cardiology  Discharge Exam: Vitals:   04/21/17 1248 04/21/17 1355  BP: 130/87 131/76  Pulse: (!) 52 (!) 45  Resp: (!) 26 20  Temp:  98.5 F (36.9 C)  SpO2: 95% 98%    General: alert pleasant oriented able to move left shoulder and left neck and able to mobilize Cardiovascular: S1-S2 no murmur rub or gallop Respiratory: clinically clear no added sound Abdomen soft nontender nondistended no rebound  Discharge Instructions   Discharge Instructions    Diet - low sodium heart healthy    Complete by:  As directed    Discharge instructions    Complete by:  As directed    You had a transient illness and we arent sure what your final diagnosis was You should stop using illicit substances immediately   Discontinue Central Line    Complete by:  As directed    Increase activity slowly    Complete by:  As directed      Current Discharge Medication List    CONTINUE these medications which have NOT CHANGED   Details  IBUPROFEN PO Take 2 tablets by mouth 2 (two) times daily as needed (pain).       No Known Allergies    The results of significant diagnostics from this hospitalization (including imaging, microbiology, ancillary and laboratory) are listed below for reference.    Significant Diagnostic Studies: Dg Chest 2 View  Result Date: 04/18/2017 CLINICAL DATA:  Pt reports onset 6 days ago pain to left shoulder, neck, and left chest. Also reports some SOB.  Pt reports he was seen at [facility] 2 days ago, dx: with pericarditis and costochondritis. Pain has worsened and fever today. Smoker EXAM: CHEST  2 VIEW COMPARISON:  04/15/2017 FINDINGS: There is focal opacity in the posterior left lung base which may reflect pneumonia or atelectasis. This was present on the prior radiographs, and is without significant change. Remainder of the lungs is clear. There may be a minimal left pleural effusion. No right pleural effusion. No pneumothorax. Heart, mediastinum  and hila are unremarkable. Skeletal structures are unremarkable. IMPRESSION: 1. Posterior left lower lobe opacity that is most likely atelectasis. Infection is possible. This is similar to what was present on the prior radiographs. 2. No other abnormalities. Electronically Signed   By: Amie Portland M.D.   On: 04/18/2017 16:31   Ct Soft Tissue Neck W Contrast  Result Date: 04/19/2017 CLINICAL DATA:  Sepsis and left neck pain.  History of IV drug use. EXAM: CT NECK WITH CONTRAST TECHNIQUE: Multidetector CT imaging of the neck was performed using the standard protocol following the bolus administration of intravenous contrast. CONTRAST:  75 mL Isovue-300 COMPARISON:  None. FINDINGS: Pharynx and larynx: --Nasopharynx: Mild hypertrophy of the adenoid tonsils. --Oral cavity and oropharynx: Enlarged palatine tonsils. No fluid collection. --Hypopharynx: Normal vallecula and pyriform sinuses. --Larynx: Normal epiglottis and pre-epiglottic space. Normal aryepiglottic and vocal folds. --Retropharyngeal space: No abscess, effusion or lymphadenopathy. Salivary glands: --Parotid: No mass lesion or inflammation. No sialolithiasis or ductal dilatation. --Submandibular: Symmetric without inflammation. No sialolithiasis or ductal dilatation. --Sublingual: Normal. No ranula or other visible lesion of the base of tongue and floor of mouth. Thyroid: Normal. Lymph nodes: Level 1A lymph node measures 5 mm. Left level 1B node measures 8 mm. Right level 5A node measures 10 mm. Vascular: Major cervical vessels are patent. Limited intracranial: Normal. Visualized orbits: Normal. Mastoids and visualized paranasal sinuses: No fluid levels or advanced mucosal thickening. Mild ethmoid and maxillary mucosal thickening. No mastoid effusion. Skeleton: No bony spinal canal stenosis. No lytic or blastic lesions. Upper chest: Clear. Other: There are multiple periapical lucencies, including teeth 17, 18, 25, 29, 30 and 31. There are numerous dental  caries. There is mild left facial skin thickening and thickening of the platysma muscle. IMPRESSION: 1. Multiple dental caries and periapical lucencies. Dental disease involving teeth 17 and 18 is adjacent to very mild lower left facial fat infiltration, possibly indicating odontogenic cellulitis in the appropriate clinical context. 2. Multiple mildly enlarged reactive cervical lymph nodes. 3. Enlarged adenoid and palatine tonsils without abscess or drainable fluid collection. In this setting, this is likely reactive lymphatic tissue. Correlate for symptoms of tonsillopharyngitis. Electronically Signed   By: Deatra Robinson M.D.   On: 04/19/2017 02:58   Ct Chest W Contrast  Result Date: 04/19/2017 CLINICAL DATA:  Sepsis, pleuritic chest pain, history of IVDA EXAM: CT CHEST WITH CONTRAST TECHNIQUE: Multidetector CT imaging of the chest was performed during intravenous contrast administration. CONTRAST:  75mL ISOVUE-300 IOPAMIDOL (ISOVUE-300) INJECTION 61% COMPARISON:  CXR 04/18/2017 FINDINGS: Cardiovascular: No significant vascular findings. Normal heart size. No pericardial effusion. Adequate opacification of the central pulmonary arteries to the lobar level. No large central pulmonary embolus. No aortic aneurysm or dissection. Mediastinum/Nodes: No enlarged mediastinal, hilar, or axillary lymph nodes. Thyroid gland, trachea, and esophagus demonstrate no significant findings. Lungs/Pleura: Small left pleural effusion with adjacent compressive atelectasis. No evidence of septic emboli. Upper Abdomen: No acute abnormality. Musculoskeletal: No chest wall abnormality. No acute or significant osseous findings. IMPRESSION: 1. Small  left pleural effusion with adjacent compressive atelectasis. 2. No large central pulmonary embolus. 3. No evidence of septic emboli. Electronically Signed   By: Tollie Eth M.D.   On: 04/19/2017 02:51   Ct Shoulder Left W Contrast  Result Date: 04/19/2017 CLINICAL DATA:  Six days of left  shoulder pain and neck pain. Diagnostic pericarditis and osteochondritis. Pain is worsened with fever today. EXAM: CT OF THE UPPER LEFT EXTREMITY WITH CONTRAST TECHNIQUE: Multidetector CT imaging of the upper left extremity was performed according to the standard protocol following intravenous contrast administration. COMPARISON:  Same day chest CT. Shoulder radiographs from 04/18/2017. CONTRAST:  75 cc of Isovue-300 as part of the same day chest CT. FINDINGS: Bones/Joint/Cartilage [facility] and glenohumeral joints are maintained without abnormal joint effusion, fracture nor bone destruction. The included sternum, upper thoracic spine and ribs are nonacute. Ligaments Suboptimally assessed by CT. Muscles and Tendons Negative Soft tissues Small left pleural effusion with compressive atelectasis. The possibility of a pneumonic consolidation with parapneumonic effusion is not entirely excluded. IMPRESSION: 1. Unremarkable CT of the left shoulder. No evidence of fracture, bone destruction or bursitis. 2. Small left pleural effusion with compressive atelectasis. The possibility of a parapneumonic effusion with pulmonary consolidation in the left lower lobe given history of fever is not entirely excluded. Electronically Signed   By: Tollie Eth M.D.   On: 04/19/2017 03:02   Dg Shoulder Left  Result Date: 04/18/2017 CLINICAL DATA:  Pain for 6 days.  Unable to lift arm. EXAM: LEFT SHOULDER - 2+ VIEW COMPARISON:  None. FINDINGS: The humeral head is well-formed and located. The subacromial, glenohumeral and acromioclavicular joint spaces are intact. No destructive bony lesions. Soft tissue planes are non-suspicious. IMPRESSION: Negative. Electronically Signed   By: Awilda Metro M.D.   On: 04/18/2017 23:47    Microbiology: Recent Results (from the past 240 hour(s))  Culture, blood (single)     Status: None (Preliminary result)   Collection Time: 04/18/17 12:47 AM  Result Value Ref Range Status   Specimen  Description BLOOD RIGHT ANTECUBITAL  Final   Special Requests   Final    BOTTLES DRAWN AEROBIC AND ANAEROBIC Blood Culture adequate volume   Culture NO GROWTH 2 DAYS  Final   Report Status PENDING  Incomplete  Culture, blood (routine x 2)     Status: None (Preliminary result)   Collection Time: 04/18/17 10:45 PM  Result Value Ref Range Status   Specimen Description BLOOD RIGHT ANTECUBITAL  Final   Special Requests   Final    BOTTLES DRAWN AEROBIC AND ANAEROBIC Blood Culture adequate volume   Culture NO GROWTH 2 DAYS  Final   Report Status PENDING  Incomplete  Culture, blood (routine x 2)     Status: None (Preliminary result)   Collection Time: 04/18/17 11:00 PM  Result Value Ref Range Status   Specimen Description BLOOD LEFT ANTECUBITAL  Final   Special Requests IN PEDIATRIC BOTTLE Blood Culture adequate volume  Final   Culture NO GROWTH 2 DAYS  Final   Report Status PENDING  Incomplete     Labs: Basic Metabolic Panel:  Recent Labs Lab 04/18/17 1545 04/19/17 0406 04/20/17 0739  NA 137 138 139  K 3.3* 3.5 3.5  CL 105 108 110  CO2 GLUCOSE 103* 95 113*  BUN 7 5* 7  CREATININE 0.53* 0.51* 0.58*  CALCIUM 8.8* 7.9* 8.6*  MG  --  1.5*  --    Liver Function Tests:  Recent Labs Lab 04/18/17 1545  AST 16  ALT 7*  ALKPHOS 57  BILITOT 0.9  PROT 7.0  ALBUMIN 3.0*   No results for input(s): LIPASE, AMYLASE in the last 168 hours. No results for input(s): AMMONIA in the last 168 hours. CBC:  Recent Labs Lab 04/18/17 1545 04/19/17 0406 04/20/17 0739  WBC 11.2* 8.5 9.6  NEUTROABS 9.7*  --   --   HGB 11.2* 10.2* 10.8*  HCT 33.2* 31.1* 32.7*  MCV 87.8 88.9 88.1  PLT 344 228 294   Cardiac Enzymes: No results for input(s): CKTOTAL, CKMB, CKMBINDEX, TROPONINI in the last 168 hours. BNP: BNP (last 3 results) No results for input(s): BNP in the last 8760 hours.  ProBNP (last 3 results) No results for input(s): PROBNP in the last 8760  hours.  CBG:  Recent Labs Lab 04/19/17 0808 04/20/17 0820 04/21/17 0754  GLUCAP 114* 98 110*       Signed:  Rhetta Mura MD   [facility] 04/21/2017, 4:26 PM

## 2017-04-21 NOTE — Transfer of Care (Signed)
Immediate Anesthesia Transfer of Care Note  Patient: Kevin Ashley  Procedure(s) Performed: Procedure(s): TRANSESOPHAGEAL ECHOCARDIOGRAM (TEE) (N/A)  Patient Location: Endoscopy Unit  Anesthesia Type:MAC  Level of Consciousness: awake, alert , oriented and patient cooperative  Airway & Oxygen Therapy: Patient Spontanous Breathing and Patient connected to nasal cannula oxygen  Post-op Assessment: Report given to RN and Post -op Vital signs reviewed and stable  Post vital signs: Reviewed and stable  Last Vitals:  Vitals:   04/21/17 1235 04/21/17 1240  BP: (!) 144/94   Pulse: 63 61  Resp: 10 15  Temp: 36.7 C   SpO2: 90% 93%    Last Pain:  Vitals:   04/21/17 1240  TempSrc:   PainSc: 4       Patients Stated Pain Goal: 3 (67/12/45 8099)  Complications: No apparent anesthesia complications

## 2017-04-21 NOTE — Progress Notes (Signed)
Pt given discharge instructions and care notes. Pt verbalized understanding AEB no further questions or concerns at this time. IV was discontinued, no redness, pain, or swelling noted at this time. Telemetry discontinued and Centralized Telemetry was notified. Pt left the floor via wheelchair with staff in stable condition. 

## 2017-04-21 NOTE — CV Procedure (Signed)
INDICATIONS:   The patient is 31 year old male with bacteremia is here for r/o endocarditis.Marland Kitchen  PROCEDURE:  Informed consent was discussed including risks, benefits and alternatives for the procedure.  Risks include, but are not limited to, cough, sore throat, vomiting, nausea, somnolence, esophageal and stomach trauma or perforation, bleeding, low blood pressure, aspiration, pneumonia, infection, trauma to the teeth and death.    Patient was given sedation.  The oropharynx was anesthetized with topical lidocaine.  The transesophageal probe was inserted in the esophagus and stomach and multiple views were obtained.  Agitated saline was used after the transesophageal probe was removed from the body.  The patient was kept under observation until the patient left the procedure room.  The patient left the procedure room in stable condition.   COMPLICATIONS:  There were no immediate complications.  FINDINGS:  1. LEFT VENTRICLE: The left ventricle is normal in structure and function.  Wall motion is normal.  No thrombus or masses seen in the left ventricle.  2. RIGHT VENTRICLE:  The right ventricle is normal in structure and function without any thrombus or masses.    3. LEFT ATRIUM:  The left atrium is normal without any thrombus or masses.  4. LEFT ATRIAL APPENDAGE:  The left atrial appendage is free of any thrombus or masses.  5. RIGHT ATRIUM:  The right atrium is free of any thrombus or masses.    6. ATRIAL SEPTUM:  The atrial septum is normal without any ASD or PFO.  7. MITRAL VALVE:  The mitral valve is normal in structure and function with mild regurgitation, no masses, stenosis or vegetations.  8. TRICUSPID VALVE:  The tricuspid valve is normal in structure and function with mild regurgitation, no masses, stenosis or vegetations.  9. AORTIC VALVE:  The aortic valve is normal in structure and function without regurgitation, masses, stenosis or vegetations.  10. PULMONIC VALVE:  The  pulmonic valve is normal in structure and function with mild regurgitation, masses, stenosis or vegetations.  11. AORTIC ARCH, ASCENDING AND DESCENDING AORTA:  The aorta had no atherosclerosis in the ascending.  12.  Superior Vena Cava : No thrombus or catheter.  13.  Pulmonary Veins: partially Visible.  14.  Pulmonary artery: visible and normal.   IMPRESSION:   1. Normal LV systolic function. 2. No ASD or PFO 3. Mild MR, TR and PI.  RECOMMENDATIONS:    Medical treatment.

## 2017-04-21 NOTE — Anesthesia Preprocedure Evaluation (Addendum)
Anesthesia Evaluation  Patient identified by MRN, date of birth, ID band Patient awake    Reviewed: Allergy & Precautions, NPO status , Patient's Chart, lab work & pertinent test results  Airway Mallampati: II  TM Distance: >3 FB Neck ROM: Full    Dental   Pulmonary Current Smoker,    breath sounds clear to auscultation       Cardiovascular  Rhythm:Regular Rate:Normal  Bacteremia. Rule out endocarditis   Neuro/Psych negative neurological ROS     GI/Hepatic negative GI ROS, (+)     substance abuse  ,   Endo/Other  negative endocrine ROS  Renal/GU negative Renal ROS     Musculoskeletal   Abdominal   Peds  Hematology negative hematology ROS (+)   Anesthesia Other Findings   Reproductive/Obstetrics                             Anesthesia Physical Anesthesia Plan  ASA: III  Anesthesia Plan: MAC   Post-op Pain Management:    Induction: Intravenous  PONV Risk Score and Plan: 0 and Propofol infusion and Treatment may vary due to age or medical condition  Airway Management Planned: Natural Airway and Nasal Cannula  Additional Equipment:   Intra-op Plan:   Post-operative Plan:   Informed Consent: I have reviewed the patients History and Physical, chart, labs and discussed the procedure including the risks, benefits and alternatives for the proposed anesthesia with the patient or authorized representative who has indicated his/her understanding and acceptance.     Plan Discussed with: CRNA  Anesthesia Plan Comments:         Anesthesia Quick Evaluation

## 2017-04-21 NOTE — Interval H&P Note (Signed)
History and Physical Interval Note:  04/21/2017 11:53 AM  Kevin Ashley  has presented today for surgery, with the diagnosis of Bacteremia  The various methods of treatment have been discussed with the patient and family. After consideration of risks, benefits and other options for treatment, the patient has consented to  Procedure(s): TRANSESOPHAGEAL ECHOCARDIOGRAM (TEE) (N/A) as a surgical intervention .  The patient's history has been reviewed, patient examined, no change in status, stable for surgery.  I have reviewed the patient's chart and labs.  Questions were answered to the patient's satisfaction.     Sims Laday S

## 2017-04-21 NOTE — H&P (View-Only) (Signed)
Referring Physician: Dr. Verneita Griffes, MD  Kevin Ashley is an 31 y.o. male.                       Chief Complaint: Bacteremia in patient with drug use history, dental caries and shoulder pain  HPI: 31 year old male presented with chest pain, left shoulder pain, fever and chills x 1 week. Patient has h/o IV drug abuse, fever, elevated WBC count and possible sepsis. Consult is for TEE to r/o endocarditis.  Past Medical History:  Diagnosis Date  . IVDU (intravenous drug user)   . Marijuana abuse   . Tobacco abuse       History reviewed. No pertinent surgical history.  Family History  Problem Relation Age of Onset  . COPD Mother   . Valvular heart disease Brother    Social History:  reports that he has been smoking Cigarettes.  He has been smoking about 0.50 packs per day. He has never used smokeless tobacco. He reports that he uses drugs, including Marijuana. He reports that he does not drink alcohol.  Allergies: No Known Allergies  Medications Prior to Admission  Medication Sig Dispense Refill  . IBUPROFEN PO Take 2 tablets by mouth 2 (two) times daily as needed (pain).      Results for orders placed or performed during the hospital encounter of 04/18/17 (from the past 48 hour(s))  I-stat troponin, ED     Status: None   Collection Time: 04/18/17  7:07 PM  Result Value Ref Range   Troponin i, poc 0.00 0.00 - 0.08 ng/mL   Comment 3            Comment: Due to the release kinetics of cTnI, a negative result within the first hours of the onset of symptoms does not rule out myocardial infarction with certainty. If myocardial infarction is still suspected, repeat the test at appropriate intervals.   I-Stat CG4 Lactic Acid, ED     Status: None   Collection Time: 04/18/17  7:09 PM  Result Value Ref Range   Lactic Acid, Venous 0.70 0.5 - 1.9 mmol/L  Urinalysis, Routine w reflex microscopic     Status: Abnormal   Collection Time: 04/18/17  7:27 PM  Result Value Ref Range   Color, Urine STRAW (A) YELLOW   APPearance CLEAR CLEAR   Specific Gravity, Urine 1.002 (L) 1.005 - 1.030   pH 6.0 5.0 - 8.0   Glucose, UA NEGATIVE NEGATIVE mg/dL   Hgb urine dipstick NEGATIVE NEGATIVE   Bilirubin Urine NEGATIVE NEGATIVE   Ketones, ur NEGATIVE NEGATIVE mg/dL   Protein, ur NEGATIVE NEGATIVE mg/dL   Nitrite NEGATIVE NEGATIVE   Leukocytes, UA NEGATIVE NEGATIVE  Urine rapid drug screen (hosp performed)     Status: Abnormal   Collection Time: 04/18/17  7:27 PM  Result Value Ref Range   Opiates NONE DETECTED NONE DETECTED   Cocaine NONE DETECTED NONE DETECTED   Benzodiazepines NONE DETECTED NONE DETECTED   Amphetamines NONE DETECTED NONE DETECTED   Tetrahydrocannabinol POSITIVE (A) NONE DETECTED   Barbiturates NONE DETECTED NONE DETECTED    Comment:        DRUG SCREEN FOR MEDICAL PURPOSES ONLY.  IF CONFIRMATION IS NEEDED FOR ANY PURPOSE, NOTIFY LAB WITHIN 5 DAYS.        LOWEST DETECTABLE LIMITS FOR URINE DRUG SCREEN Drug Class       Cutoff (ng/mL) Amphetamine      1000 Barbiturate  200 Benzodiazepine   301 Tricyclics       601 Opiates          300 Cocaine          300 THC              50   Culture, blood (routine x 2)     Status: None (Preliminary result)   Collection Time: 04/18/17 10:45 PM  Result Value Ref Range   Specimen Description BLOOD RIGHT ANTECUBITAL    Special Requests      BOTTLES DRAWN AEROBIC AND ANAEROBIC Blood Culture adequate volume   Culture PENDING    Report Status PENDING   Culture, blood (routine x 2)     Status: None (Preliminary result)   Collection Time: 04/18/17 11:00 PM  Result Value Ref Range   Specimen Description BLOOD LEFT ANTECUBITAL    Special Requests IN PEDIATRIC BOTTLE Blood Culture adequate volume    Culture NO GROWTH 1 DAY    Report Status PENDING   Magnesium     Status: Abnormal   Collection Time: 04/19/17  4:06 AM  Result Value Ref Range   Magnesium 1.5 (L) 1.7 - 2.4 mg/dL  HIV antibody (Routine Testing)      Status: None   Collection Time: 04/19/17  4:06 AM  Result Value Ref Range   HIV Screen 4th Generation wRfx Non Reactive Non Reactive    Comment: (NOTE) Performed At: [facility] [place], [place] [id number] Lindon Romp MD [place]:[id number]   Basic metabolic panel     Status: Abnormal   Collection Time: 04/19/17  4:06 AM  Result Value Ref Range   Sodium 138 135 - 145 mmol/L   Potassium 3.5 3.5 - 5.1 mmol/L   Chloride 108 101 - 111 mmol/L   CO2 23 22 - 32 mmol/L   Glucose, Bld 95 65 - 99 mg/dL   BUN 5 (L) 6 - 20 mg/dL   Creatinine, Ser 0.51 (L) 0.61 - 1.24 mg/dL   Calcium 7.9 (L) 8.9 - 10.3 mg/dL   GFR calc non Af Amer >60 >60 mL/min   GFR calc Af Amer >60 >60 mL/min    Comment: (NOTE) The eGFR has been calculated using the CKD EPI equation. This calculation has not been validated in all clinical situations. eGFR's persistently <60 mL/min signify possible Chronic Kidney Disease.    Anion gap 7 5 - 15  CBC     Status: Abnormal   Collection Time: 04/19/17  4:06 AM  Result Value Ref Range   WBC 8.5 4.0 - 10.5 K/uL   RBC 3.50 (L) 4.22 - 5.81 MIL/uL   Hemoglobin 10.2 (L) 13.0 - 17.0 g/dL   HCT 31.1 (L) 39.0 - 52.0 %   MCV 88.9 78.0 - 100.0 fL   MCH 29.1 26.0 - 34.0 pg   MCHC 32.8 30.0 - 36.0 g/dL   RDW 13.6 11.5 - 15.5 %   Platelets 228 150 - 400 K/uL  Lactic acid, plasma     Status: None   Collection Time: 04/19/17  4:06 AM  Result Value Ref Range   Lactic Acid, Venous 0.8 0.5 - 1.9 mmol/L  Procalcitonin     Status: None   Collection Time: 04/19/17  4:06 AM  Result Value Ref Range   Procalcitonin 0.58 ng/mL    Comment:        Interpretation: PCT > 0.5 ng/mL and <= 2 ng/mL: Systemic infection (sepsis) is possible, but other conditions are  known to elevate PCT as well. (NOTE)         ICU PCT Algorithm               Non ICU PCT Algorithm    ----------------------------     ------------------------------         PCT < 0.25 ng/mL                  PCT < 0.1 ng/mL     Stopping of antibiotics            Stopping of antibiotics       strongly encouraged.               strongly encouraged.    ----------------------------     ------------------------------       PCT level decrease by               PCT < 0.25 ng/mL       >= 80% from peak PCT       OR PCT 0.25 - 0.5 ng/mL          Stopping of antibiotics                                             encouraged.     Stopping of antibiotics           encouraged.    ----------------------------     ------------------------------       PCT level decrease by              PCT >= 0.25 ng/mL       < 80% from peak PCT        AND PCT >= 0.5 ng/mL             Continuing antibiotics                                              encouraged.       Continuing antibiotics            encouraged.    ----------------------------     ------------------------------     PCT level increase compared          PCT > 0.5 ng/mL         with peak PCT AND          PCT >= 0.5 ng/mL             Escalation of antibiotics                                          strongly encouraged.      Escalation of antibiotics        strongly encouraged.   Protime-INR     Status: None   Collection Time: 04/19/17  4:06 AM  Result Value Ref Range   Prothrombin Time 14.5 11.4 - 15.2 seconds   INR 1.13   APTT     Status: Abnormal   Collection Time: 04/19/17  4:06 AM  Result Value Ref Range   aPTT 77 (H) 24 - 36 seconds    Comment:  IF BASELINE aPTT IS ELEVATED, SUGGEST PATIENT RISK ASSESSMENT BE USED TO DETERMINE APPROPRIATE ANTICOAGULANT THERAPY.   C-reactive protein     Status: Abnormal   Collection Time: 04/19/17  4:06 AM  Result Value Ref Range   CRP 9.3 (H) <1.0 mg/dL  Sedimentation rate     Status: Abnormal   Collection Time: 04/19/17  4:06 AM  Result Value Ref Range   Sed Rate 84 (H) 0 - 16 mm/hr  I-stat troponin, ED     Status: None   Collection Time: 04/19/17  4:19 AM  Result Value Ref Range   Troponin  i, poc 0.00 0.00 - 0.08 ng/mL   Comment 3            Comment: Due to the release kinetics of cTnI, a negative result within the first hours of the onset of symptoms does not rule out myocardial infarction with certainty. If myocardial infarction is still suspected, repeat the test at appropriate intervals.   CBG monitoring, ED     Status: Abnormal   Collection Time: 04/19/17  8:08 AM  Result Value Ref Range   Glucose-Capillary 114 (H) 65 - 99 mg/dL  CBC     Status: Abnormal   Collection Time: 04/20/17  7:39 AM  Result Value Ref Range   WBC 9.6 4.0 - 10.5 K/uL   RBC 3.71 (L) 4.22 - 5.81 MIL/uL   Hemoglobin 10.8 (L) 13.0 - 17.0 g/dL   HCT 32.7 (L) 39.0 - 52.0 %   MCV 88.1 78.0 - 100.0 fL   MCH 29.1 26.0 - 34.0 pg   MCHC 33.0 30.0 - 36.0 g/dL   RDW 13.4 11.5 - 15.5 %   Platelets 294 150 - 400 K/uL  Basic metabolic panel     Status: Abnormal   Collection Time: 04/20/17  7:39 AM  Result Value Ref Range   Sodium 139 135 - 145 mmol/L   Potassium 3.5 3.5 - 5.1 mmol/L   Chloride 110 101 - 111 mmol/L   CO2 24 22 - 32 mmol/L   Glucose, Bld 113 (H) 65 - 99 mg/dL   BUN 7 6 - 20 mg/dL   Creatinine, Ser 0.58 (L) 0.61 - 1.24 mg/dL   Calcium 8.6 (L) 8.9 - 10.3 mg/dL   GFR calc non Af Amer >60 >60 mL/min   GFR calc Af Amer >60 >60 mL/min    Comment: (NOTE) The eGFR has been calculated using the CKD EPI equation. This calculation has not been validated in all clinical situations. eGFR's persistently <60 mL/min signify possible Chronic Kidney Disease.    Anion gap 5 5 - 15  Glucose, capillary     Status: None   Collection Time: 04/20/17  8:20 AM  Result Value Ref Range   Glucose-Capillary 98 65 - 99 mg/dL   Ct Soft Tissue Neck W Contrast  Result Date: 04/19/2017 CLINICAL DATA:  Sepsis and left neck pain.  History of IV drug use. EXAM: CT NECK WITH CONTRAST TECHNIQUE: Multidetector CT imaging of the neck was performed using the standard protocol following the bolus administration of  intravenous contrast. CONTRAST:  75 mL Isovue-300 COMPARISON:  None. FINDINGS: Pharynx and larynx: --Nasopharynx: Mild hypertrophy of the adenoid tonsils. --Oral cavity and oropharynx: Enlarged palatine tonsils. No fluid collection. --Hypopharynx: Normal vallecula and pyriform sinuses. --Larynx: Normal epiglottis and pre-epiglottic space. Normal aryepiglottic and vocal folds. --Retropharyngeal space: No abscess, effusion or lymphadenopathy. Salivary glands: --Parotid: No mass lesion or inflammation. No sialolithiasis or ductal dilatation. --Submandibular: Symmetric  without inflammation. No sialolithiasis or ductal dilatation. --Sublingual: Normal. No ranula or other visible lesion of the base of tongue and floor of mouth. Thyroid: Normal. Lymph nodes: Level 1A lymph node measures 5 mm. Left level 1B node measures 8 mm. Right level 5A node measures 10 mm. Vascular: Major cervical vessels are patent. Limited intracranial: Normal. Visualized orbits: Normal. Mastoids and visualized paranasal sinuses: No fluid levels or advanced mucosal thickening. Mild ethmoid and maxillary mucosal thickening. No mastoid effusion. Skeleton: No bony spinal canal stenosis. No lytic or blastic lesions. Upper chest: Clear. Other: There are multiple periapical lucencies, including teeth 17, 18, 25, 29, 30 and 31. There are numerous dental caries. There is mild left facial skin thickening and thickening of the platysma muscle. IMPRESSION: 1. Multiple dental caries and periapical lucencies. Dental disease involving teeth 17 and 18 is adjacent to very mild lower left facial fat infiltration, possibly indicating odontogenic cellulitis in the appropriate clinical context. 2. Multiple mildly enlarged reactive cervical lymph nodes. 3. Enlarged adenoid and palatine tonsils without abscess or drainable fluid collection. In this setting, this is likely reactive lymphatic tissue. Correlate for symptoms of tonsillopharyngitis. Electronically Signed    By: Ulyses Jarred M.D.   On: 04/19/2017 02:58   Ct Chest W Contrast  Result Date: 04/19/2017 CLINICAL DATA:  Sepsis, pleuritic chest pain, history of IVDA EXAM: CT CHEST WITH CONTRAST TECHNIQUE: Multidetector CT imaging of the chest was performed during intravenous contrast administration. CONTRAST:  20m ISOVUE-300 IOPAMIDOL (ISOVUE-300) INJECTION 61% COMPARISON:  CXR 04/18/2017 FINDINGS: Cardiovascular: No significant vascular findings. Normal heart size. No pericardial effusion. Adequate opacification of the central pulmonary arteries to the lobar level. No large central pulmonary embolus. No aortic aneurysm or dissection. Mediastinum/Nodes: No enlarged mediastinal, hilar, or axillary lymph nodes. Thyroid gland, trachea, and esophagus demonstrate no significant findings. Lungs/Pleura: Small left pleural effusion with adjacent compressive atelectasis. No evidence of septic emboli. Upper Abdomen: No acute abnormality. Musculoskeletal: No chest wall abnormality. No acute or significant osseous findings. IMPRESSION: 1. Small left pleural effusion with adjacent compressive atelectasis. 2. No large central pulmonary embolus. 3. No evidence of septic emboli. Electronically Signed   By: DAshley RoyaltyM.D.   On: 04/19/2017 02:51   Ct Shoulder Left W Contrast  Result Date: 04/19/2017 CLINICAL DATA:  Six days of left shoulder pain and neck pain. Diagnostic pericarditis and osteochondritis. Pain is worsened with fever today. EXAM: CT OF THE UPPER LEFT EXTREMITY WITH CONTRAST TECHNIQUE: Multidetector CT imaging of the upper left extremity was performed according to the standard protocol following intravenous contrast administration. COMPARISON:  Same day chest CT. Shoulder radiographs from 04/18/2017. CONTRAST:  75 cc of Isovue-300 as part of the same day chest CT. FINDINGS: Bones/Joint/Cartilage [facility] are maintained without abnormal joint effusion, fracture nor bone destruction. The included  sternum, upper thoracic spine and ribs are nonacute. Ligaments Suboptimally assessed by CT. Muscles and Tendons Negative Soft tissues Small left pleural effusion with compressive atelectasis. The possibility of a pneumonic consolidation with parapneumonic effusion is not entirely excluded. IMPRESSION: 1. Unremarkable CT of the left shoulder. No evidence of fracture, bone destruction or bursitis. 2. Small left pleural effusion with compressive atelectasis. The possibility of a parapneumonic effusion with pulmonary consolidation in the left lower lobe given history of fever is not entirely excluded. Electronically Signed   By: DAshley RoyaltyM.D.   On: 04/19/2017 03:02   Dg Shoulder Left  Result Date: 04/18/2017 CLINICAL DATA:  Pain for 6 days.  Unable to lift arm. EXAM: LEFT SHOULDER - 2+ VIEW COMPARISON:  None. FINDINGS: The humeral head is well-formed and located. The subacromial, glenohumeral and acromioclavicular joint spaces are intact. No destructive bony lesions. Soft tissue planes are non-suspicious. IMPRESSION: Negative. Electronically Signed   By: Elon Alas M.D.   On: 04/18/2017 23:47    Review Of Systems Constitutional: Positive fever, chills, no weight loss or gain. Eyes: No vision change, wears glasses. No discharge or pain. Ears: No hearing loss, No tinnitus. Respiratory: No asthma, COPD, pneumonias. No shortness of breath. No hemoptysis. Cardiovascular: No chest pain, palpitation, leg edema. Gastrointestinal: No nausea, vomiting, diarrhea, constipation. No GI bleed. No hepatitis. Genitourinary: No dysuria, hematuria, kidney stone. No incontinance. Neurological: No headache, stroke, seizures.  Psychiatry: No psych facility admission for anxiety, depression, suicide. No detox. Skin: No rash. Musculoskeletal: Positive joint pain, no fibromyalgia. No neck pain, back pain. Lymphadenopathy: No lymphadenopathy. Hematology: No anemia or easy bruising.   Blood pressure 116/76, pulse  (!) 42, temperature 98.3 F (36.8 C), temperature source Oral, resp. rate 17, height '5\' 8"'$  (1.727 m), weight 75.4 kg (166 lb 3.2 oz), SpO2 100 %. Body mass index is 25.27 kg/m. General appearance: alert, cooperative, appears stated age and no distress Head: Normocephalic, atraumatic. Eyes: pink conjunctiva, corneas clear. PERRL, EOM's intact. Neck: No adenopathy, no carotid bruit, no JVD, supple, symmetrical, trachea midline and thyroid not enlarged. Resp: Clear to auscultation bilaterally. Cardio: Regular rate and rhythm, S1, S2 normal, II/VI systolic murmur, no click, rub or gallop GI: Soft, non-tender; bowel sounds normal; no organomegaly. Extremities: No edema, cyanosis or clubbing. Skin: Warm and dry.  Neurologic: Alert and oriented X 3, normal strength. Normal coordination and gait.  Assessment/Plan Bacteremia R/O endocarditis Possible pneumonia Multiple dental caries Left shoulder pain H/O drug use/abuse  TEE in AM. Patient understood risks and procedure.  Birdie Riddle, MD  04/20/2017, 6:57 PM

## 2017-04-21 NOTE — Progress Notes (Signed)
  Echocardiogram Echocardiogram Transesophageal has been performed.  Roosvelt Maser F 04/21/2017, 12:57 PM

## 2017-04-22 NOTE — Anesthesia Postprocedure Evaluation (Signed)
Anesthesia Post Note  Patient: Kevin Ashley  Procedure(s) Performed: Procedure(s) (LRB): TRANSESOPHAGEAL ECHOCARDIOGRAM (TEE) (N/A)     Patient location during evaluation: PACU Anesthesia Type: MAC Level of consciousness: awake and alert Pain management: pain level controlled Vital Signs Assessment: post-procedure vital signs reviewed and stable Respiratory status: spontaneous breathing, nonlabored ventilation, respiratory function stable and patient connected to nasal cannula oxygen Cardiovascular status: stable and blood pressure returned to baseline Postop Assessment: no apparent nausea or vomiting Anesthetic complications: no    Last Vitals:  Vitals:   04/21/17 1248 04/21/17 1355  BP: 130/87 131/76  Pulse: (!) 52 (!) 45  Resp: (!) 26 20  Temp:  36.9 C  SpO2: 95% 98%    Last Pain:  Vitals:   04/21/17 1541  TempSrc:   PainSc: 4                  Tiajuana Amass

## 2017-04-24 LAB — CULTURE, BLOOD (ROUTINE X 2)
CULTURE: NO GROWTH
Culture: NO GROWTH
SPECIAL REQUESTS: ADEQUATE
SPECIAL REQUESTS: ADEQUATE

## 2017-04-24 LAB — CULTURE, BLOOD (SINGLE)
Culture: NO GROWTH
SPECIAL REQUESTS: ADEQUATE

## 2018-05-07 IMAGING — CT CT NECK W/ CM
3 of 4 series · 13 of 33 positions shown, 16 images · IV contrast (Omni 300)
Comparison: None.

CLINICAL DATA: Sepsis and left neck pain.  History of IV drug use.

EXAM:
CT NECK WITH CONTRAST
TECHNIQUE: Multidetector CT imaging of the neck was performed using the
standard protocol following the bolus administration of intravenous
contrast.
CONTRAST:  75 mL Osovue-ZRR

[Series 1: neck 2.0 st · axial · 0.54mm/px · z∈[-233,-75]mm · 5 of 119 slices shown, 7 images (1 of 3)]
[im 20/119  soft-tissue]
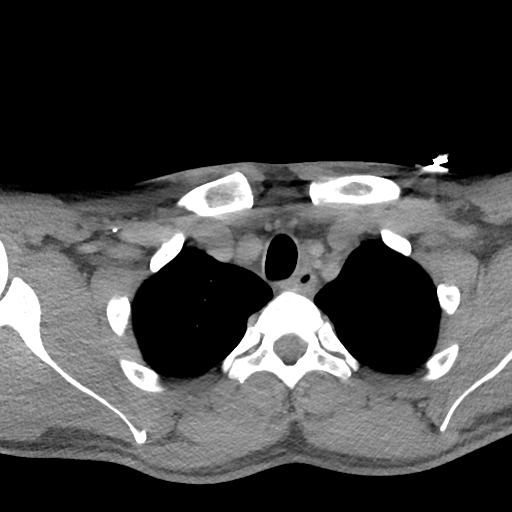
[im 20/119  bone]
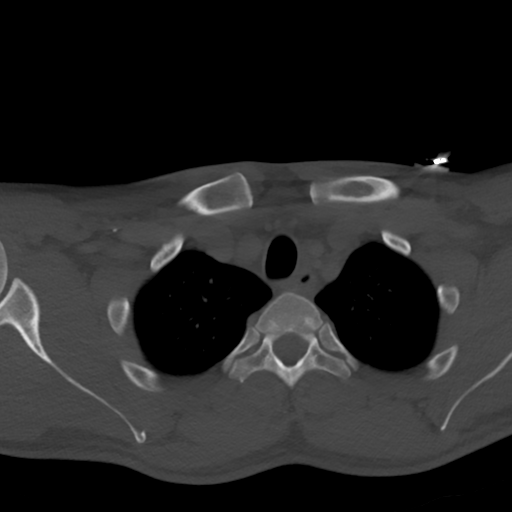
[im 40/119  bone]
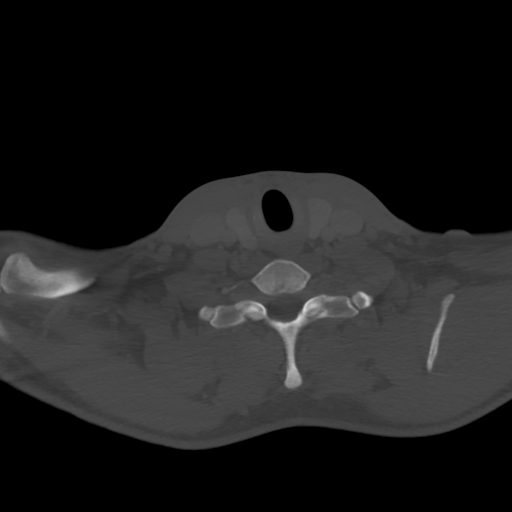
[im 60/119  bone]
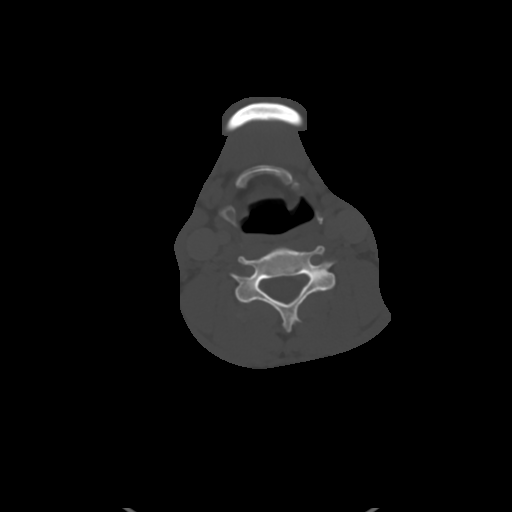
[im 79/119  bone]
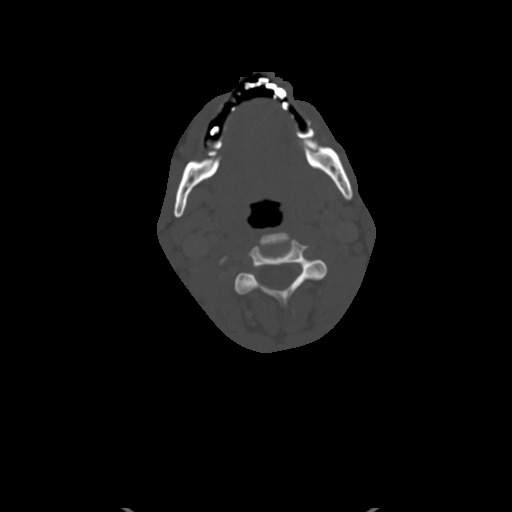
[im 99/119  soft-tissue]
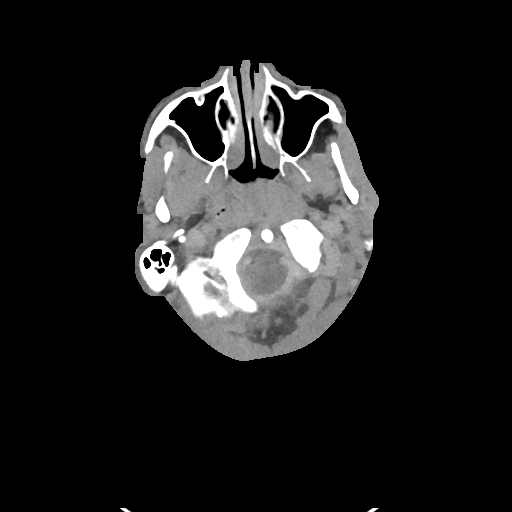
[im 99/119  bone]
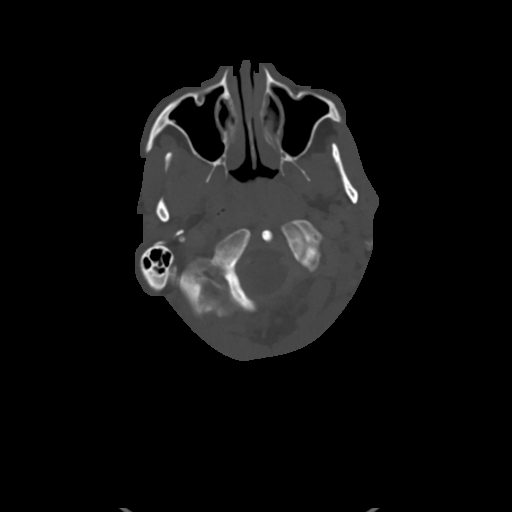

[Series 6: neck 2.0 st · sagittal · 0.46mm/px · 5 of 101 slices shown, 6 images (2 of 3)]
[im 34/101  bone]
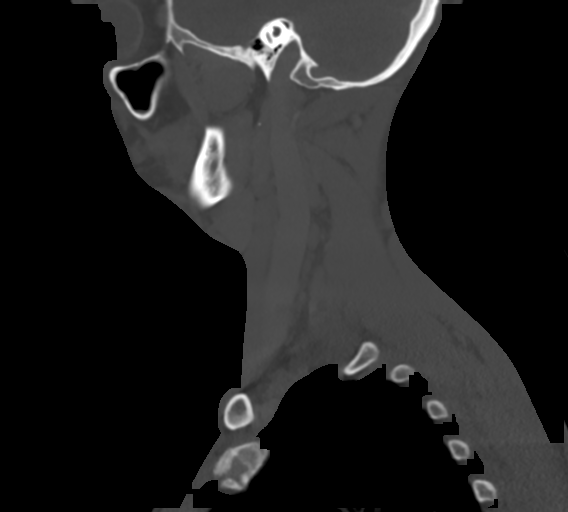
[im 42/101  bone]
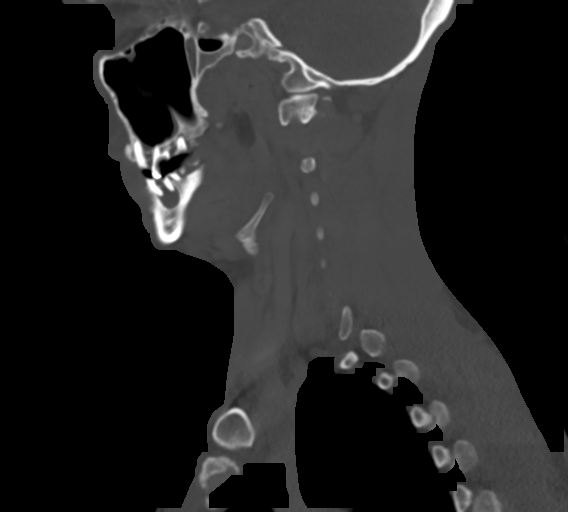
[im 51/101  soft-tissue]
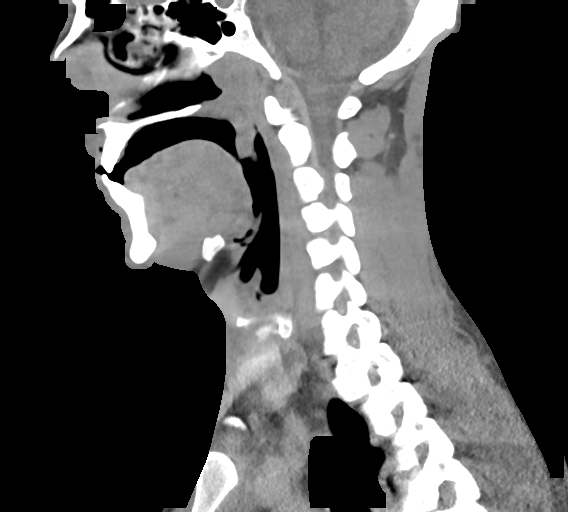
[im 51/101  bone]
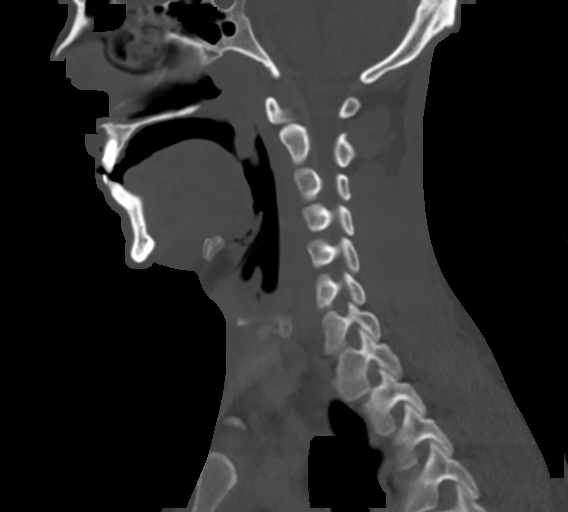
[im 59/101  bone]
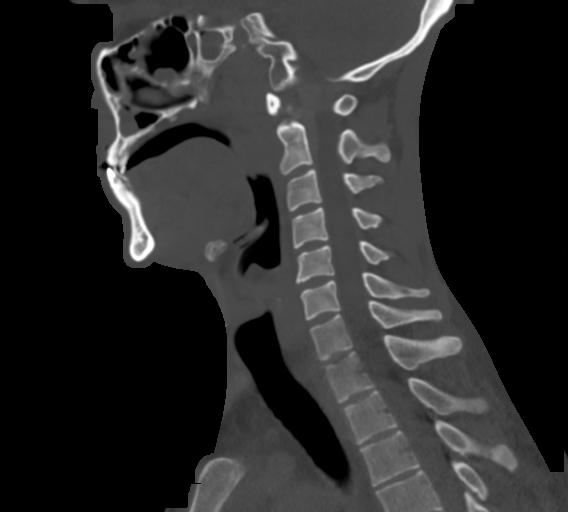
[im 67/101  bone]
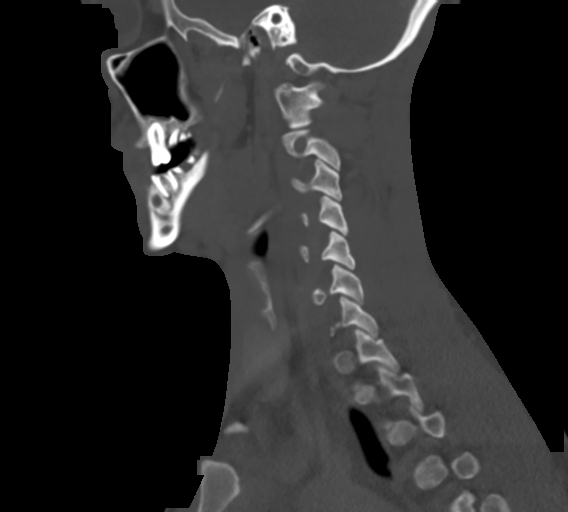

[Series 7: neck 2.0 st · coronal · 0.46mm/px · 3 of 101 slices shown (3 of 3)]
[im 21/101  bone]
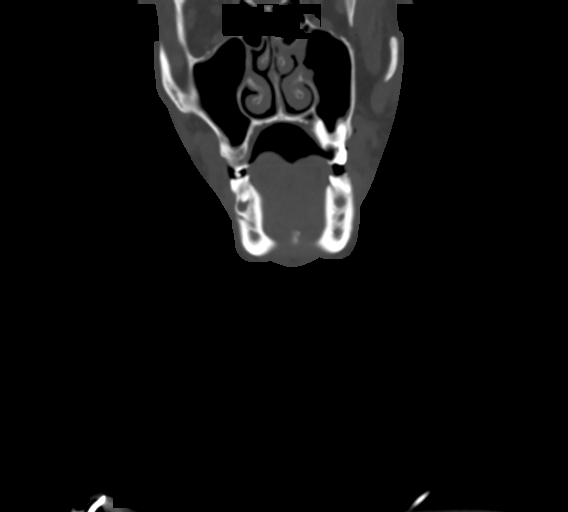
[im 41/101  bone]
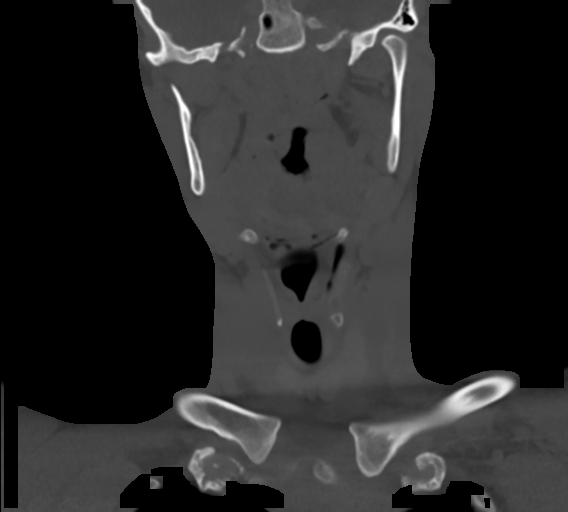
[im 61/101  bone]
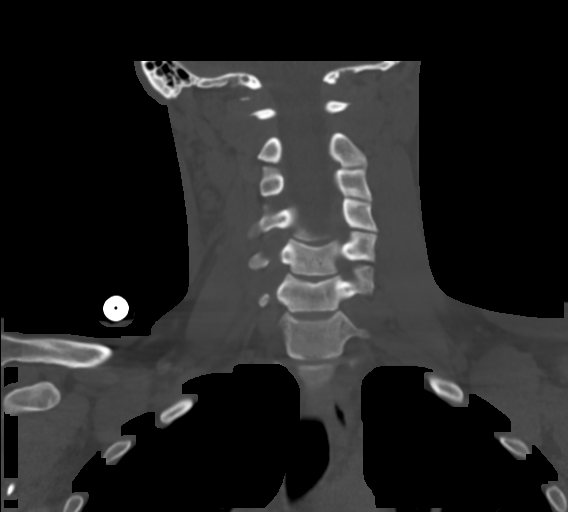

[13 of 33 positions shown; findings below may reference images not displayed]

FINDINGS: Pharynx and larynx:

--Nasopharynx: Mild hypertrophy of the adenoid tonsils.

--Oral cavity and oropharynx: Enlarged palatine tonsils. No fluid
collection.

--Hypopharynx: Normal vallecula and pyriform sinuses.

--Larynx: Normal epiglottis and pre-epiglottic space. Normal
aryepiglottic and vocal folds.

--Retropharyngeal space: No abscess, effusion or lymphadenopathy.

Salivary glands:

--Parotid: No mass lesion or inflammation. No sialolithiasis or
ductal dilatation.

--Submandibular: Symmetric without inflammation. No sialolithiasis
or ductal dilatation.

--Sublingual: Normal. No ranula or other visible lesion of the base
of tongue and floor of mouth.

Thyroid: Normal.

Lymph nodes: Level 1A lymph node measures 5 mm. Left level 1B node
measures 8 mm. Right level 5A node measures 10 mm.

Vascular: Major cervical vessels are patent.

Limited intracranial: Normal.

Visualized orbits: Normal.

Mastoids and visualized paranasal sinuses: No fluid levels or
advanced mucosal thickening. Mild ethmoid and maxillary mucosal
thickening. No mastoid effusion.

Skeleton: No bony spinal canal stenosis. No lytic or blastic
lesions.

Upper chest: Clear.

Other: There are multiple periapical lucencies, including teeth 17,
18, 25, 29, 30 and 31. There are numerous dental caries. There is
mild left facial skin thickening and thickening of the platysma
muscle.
IMPRESSION: 1. Multiple dental caries and periapical lucencies. Dental disease
involving teeth 17 and 18 is adjacent to very mild lower left facial
fat infiltration, possibly indicating odontogenic cellulitis in the
appropriate clinical context.
2. Multiple mildly enlarged reactive cervical lymph nodes.
3. Enlarged adenoid and palatine tonsils without abscess or
drainable fluid collection. In this setting, this is likely reactive
lymphatic tissue. Correlate for symptoms of tonsillopharyngitis.
